# Patient Record
Sex: Female | Born: 1980 | Race: White | Hispanic: No | Marital: Married | Smoking: Former smoker
Health system: Southern US, Community
[De-identification: ages and names within clinical notes are randomized; demographics above are authoritative.]

## PROBLEM LIST (undated history)

## (undated) DIAGNOSIS — T7840XA Allergy, unspecified, initial encounter: Secondary | ICD-10-CM

## (undated) DIAGNOSIS — F419 Anxiety disorder, unspecified: Secondary | ICD-10-CM

## (undated) DIAGNOSIS — E079 Disorder of thyroid, unspecified: Secondary | ICD-10-CM

## (undated) DIAGNOSIS — F32A Depression, unspecified: Secondary | ICD-10-CM

## (undated) DIAGNOSIS — J45909 Unspecified asthma, uncomplicated: Secondary | ICD-10-CM

## (undated) HISTORY — DX: Anxiety disorder, unspecified: F41.9

## (undated) HISTORY — DX: Unspecified asthma, uncomplicated: J45.909

## (undated) HISTORY — PX: TONSILLECTOMY: SUR1361

## (undated) HISTORY — DX: Depression, unspecified: F32.A

## (undated) HISTORY — DX: Allergy, unspecified, initial encounter: T78.40XA

## (undated) HISTORY — PX: ADENOIDECTOMY: SUR15

## (undated) HISTORY — DX: Disorder of thyroid, unspecified: E07.9

---

## 2014-11-26 DIAGNOSIS — N809 Endometriosis, unspecified: Secondary | ICD-10-CM

## 2014-11-26 HISTORY — DX: Endometriosis, unspecified: N80.9

## 2022-01-18 ENCOUNTER — Other Ambulatory Visit: Payer: Self-pay

## 2022-01-18 ENCOUNTER — Ambulatory Visit: Payer: 59 | Admitting: Family

## 2022-01-18 ENCOUNTER — Encounter: Payer: Self-pay | Admitting: Family

## 2022-01-18 VITALS — BP 124/83 | HR 67 | Temp 98.3°F | Ht 67.0 in | Wt 184.0 lb

## 2022-01-18 DIAGNOSIS — G8929 Other chronic pain: Secondary | ICD-10-CM

## 2022-01-18 DIAGNOSIS — E039 Hypothyroidism, unspecified: Secondary | ICD-10-CM | POA: Insufficient documentation

## 2022-01-18 DIAGNOSIS — E042 Nontoxic multinodular goiter: Secondary | ICD-10-CM | POA: Insufficient documentation

## 2022-01-18 DIAGNOSIS — M549 Dorsalgia, unspecified: Secondary | ICD-10-CM | POA: Diagnosis not present

## 2022-01-18 DIAGNOSIS — F411 Generalized anxiety disorder: Secondary | ICD-10-CM | POA: Insufficient documentation

## 2022-01-18 MED ORDER — TIZANIDINE HCL 4 MG PO TABS: 4.0000 mg | ORAL_TABLET | Freq: Four times a day (QID) | ORAL | 0 refills | Status: DC | PRN

## 2022-01-18 NOTE — Assessment & Plan Note (Signed)
Chronic - last scan done early last year, referring to ENDO to follow.

## 2022-01-18 NOTE — Progress Notes (Signed)
New Patient Office Visit  Subjective:  Patient ID: Dana Underwood, female    DOB: Sep 19, 1981  Age: 41 y.o. MRN: [id number]  CC:  Chief Complaint  Patient presents with   Establish Care   Back Pain    Ongoing for 1 year. She has been treated with Prednisone, but still has issues.   Thyroid Problem    hypothyroidism & nodules    HPI Dana Underwood presents for establishing care and to discuss one chronic problem. Pain She reports chronic back pain. There was an injury that may have caused the pain. The pain started about a year ago and is gradually worsening. The pain does not radiate. The pain is described as aching, sharp, and soreness, occurring intermittently. Symptoms are worse in the: evening, nighttime  Aggravating factors: bending backwards, bending forwards, bending sideways, standing, and walking She has tried application of heat, application of ice, and NSAIDs with little relief. 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 Hypothyroidism: Patient presents today for followup of Hypothyroidism. pt also reports hx of thyroid nodules that she monitors annually with Endocrinology. Patient reports positive compliance with daily medication.  Patient denies any of the following symptoms: fatigue, cold intolerance, constipation, weight gain or inability to lose weight, muscle weakness, mental slowing, dry hair and skin. Last TSH and free T4: Lab Results  Component Value Date   FREE T4 1.02 01/18/2022   TSH 1.10 01/18/2022      Past Medical History:  Diagnosis Date   Allergy    Anxiety    Asthma    Depression    Endometriosis 2016   2019   Thyroid disease     History reviewed. No pertinent surgical history.  Family History  Problem Relation Age of Onset   Cancer Mother    Depression Mother    Early death Mother    Anxiety disorder Father    Depression Father    Hypertension Father    Vision loss Father    Cancer  Maternal Grandfather    Heart disease Maternal Grandmother    Hypertension Maternal Grandmother    Cancer Paternal Grandfather    Hypertension Paternal Grandmother    Stroke Paternal Grandmother    ADD / ADHD Brother    Anxiety disorder Brother    Depression Brother     Social History   Socioeconomic History   Marital status: Married    Spouse name: Not on file   Number of children: Not on file   Years of education: Not on file   Highest education level: Not on file  Occupational History   Not on file  Tobacco Use   Smoking status: Former    Types: Cigarettes, E-cigarettes    Quit date: 07/27/2014    Years since quitting: 7.4   Smokeless tobacco: Not on file  Substance and Sexual Activity   Alcohol use: Yes    Comment: Occasionally   Drug use: Not Currently    Types: Marijuana    Comment: Have used in past. Used CBD edibles over 48mos ago   Sexual activity: Yes    Birth control/protection: Other-see comments    Comment: Cycle trackimg  Other Topics Concern   Not on file  Social History Narrative   Not on file   Social Determinants of Health   Financial Resource Strain: Not on file  Food Insecurity: Not on file  Transportation Needs: Not on file  Physical Activity: Not  on file  Stress: Not on file  Social Connections: Not on file  Intimate Partner Violence: Not on file    Objective:   Today's Vitals: BP 124/83    Pulse 67    Temp 98.3 F (36.8 C) (Temporal)    Ht 5\' 7"  (1.702 m)    Wt 184 lb (83.5 kg)    LMP 01/18/2022 (Exact Date)    SpO2 98%    BMI 28.82 kg/m   Physical Exam Vitals and nursing note reviewed.  Constitutional:      Appearance: Normal appearance.  Neck:     Thyroid: No thyroid mass (unable to palpate nodules) or thyromegaly.  Cardiovascular:     Rate and Rhythm: Normal rate and regular rhythm.  Pulmonary:     Effort: Pulmonary effort is normal.     Breath sounds: Normal breath sounds.  Musculoskeletal:        General: Normal range of  motion.     Cervical back: Full passive range of motion without pain.  Skin:    General: Skin is warm and dry.  Neurological:     Mental Status: She is alert.  Psychiatric:        Mood and Affect: Mood normal.        Behavior: Behavior normal.    Assessment & Plan:   Problem List Items Addressed This Visit       Endocrine   Acquired hypothyroidism    Chronic - no blood work in over a year, has enough pills for now. Denies any sx. Checking level today.      Relevant Medications   levothyroxine (SYNTHROID) 88 MCG tablet   Other Relevant Orders   T4, free (Completed)   TSH (Completed)   Ambulatory referral to Endocrinology   Multiple thyroid nodules    Chronic - last scan done early last year, referring to ENDO to follow.      Relevant Medications   levothyroxine (SYNTHROID) 88 MCG tablet   Other Relevant Orders   Ambulatory referral to Endocrinology     Other   Other chronic back pain - Primary   pt reports last year bending over and hearing a pop and having pain ever since. She did not have assessed, pain has been off & on ever since, but worsening lately. 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 She has tried OTC meds with little relief. Starting Tizanidine, advised on use & SE, if too drowsy during day, take qhs to help with sleep issues r/t pain.  Relevant Medications   tiZANidine (ZANAFLEX) 4 MG tablet   Other Relevant Orders   Ambulatory referral to Orthopedic Surgery    Outpatient Encounter Medications as of 01/18/2022  Medication Sig   albuterol (VENTOLIN HFA) 108 (90 Base) MCG/ACT inhaler SMARTSIG:1 Puff(s) Via Inhaler Every 6 Hours PRN   cetirizine (ZYRTEC) 10 MG tablet    L-LYSINE PO Take by mouth.   levothyroxine (SYNTHROID) 88 MCG tablet Take 88 mcg by mouth daily.   LORazepam (ATIVAN) 1 MG tablet Take 0.5-1 mg by mouth daily as needed.   magnesium 30 MG tablet Take 30 mg by mouth 2 (two) times daily.   Multiple Vitamin (MULTIVITAMIN) tablet  Take 1 tablet by mouth daily.   Thiamine HCl (VITAMIN B-1 PO) Take by mouth.   tiZANidine (ZANAFLEX) 4 MG tablet Take 1 tablet (4 mg total) by mouth every 6 (six) hours as needed for muscle spasms.   UNABLE TO FIND Nutrafol   valACYclovir (  VALTREX) 1000 MG tablet Take 2,000 mg by mouth 2 (two) times daily.   No facility-administered encounter medications on file as of 01/18/2022.    Follow-up: Return in about 6 months (around 07/18/2022) for thyroid.   Dulce Sellar, NP

## 2022-01-18 NOTE — Patient Instructions (Signed)
Welcome to [facility]! It was a pleasure meeting you today.  Go to the lab for blood work today. As discussed, I have sent in a new muscle relaxer, Xanaflex, to use as needed for your back pain/muscle spasms.  I have also sent a referral to our Orthopedic office and to Endocrinology regarding your thyroid.  Please schedule a 6 month follow up visit today unless you are established with Endocrinology.    PLEASE NOTE:  If you had any LAB tests please let us know if you have not heard back within a few days. You may see your results on MyChart before we have a chance to review them but we will give you a call once they are reviewed by [place]. If we ordered any REFERRALS today, please let us know if you have not heard from their office within the next week.  Let us know through MyChart if you are needing REFILLS, or have your pharmacy send [place] the request. You can also use MyChart to communicate with me or any office staff.  Please try these tips to maintain a healthy lifestyle:  Eat most of your calories during the day when you are active. Eliminate processed foods including packaged sweets (pies, cakes, cookies), reduce intake of potatoes, white bread, white pasta, and white rice. Look for whole grain options, oat flour or almond flour.  Each meal should contain half fruits/vegetables, one quarter protein, and one quarter carbs (no bigger than a computer mouse).  Cut down on sweet beverages. This includes juice, soda, and sweet tea. Also watch fruit intake, though this is a healthier sweet option, it still contains natural sugar! Limit to 3 servings daily.  Drink at least 1 glass of water with each meal and aim for at least 8 glasses per day  Exercise at least 150 minutes every week.

## 2022-01-18 NOTE — Assessment & Plan Note (Signed)
Chronic - no blood work in over a year, has enough pills for now. Denies any sx. Checking level today.

## 2022-01-18 NOTE — Assessment & Plan Note (Deleted)
Chronic - pt has been many different meds in past and prefers to just have counseling for now, she has an upcoming appointment.

## 2022-01-19 LAB — T4, FREE: Free T4: 1.02 ng/dL (ref 0.60–1.60)

## 2022-01-19 LAB — TSH: TSH: 1.1 u[IU]/mL (ref 0.35–5.50)

## 2022-01-22 DIAGNOSIS — M549 Dorsalgia, unspecified: Secondary | ICD-10-CM | POA: Insufficient documentation

## 2022-01-22 DIAGNOSIS — G8929 Other chronic pain: Secondary | ICD-10-CM | POA: Insufficient documentation

## 2022-01-22 NOTE — Assessment & Plan Note (Signed)
pt reports last year bending over and hearing a pop and having pain ever since. She did not have assessed, pain has been off & on ever since, but worsening lately. 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 She has tried OTC meds with little relief. Starting Tizanidine, advised on use & SE, if too drowsy during day, take qhs to help with sleep issues r/t pain.

## 2022-01-24 ENCOUNTER — Other Ambulatory Visit: Payer: Self-pay

## 2022-01-24 ENCOUNTER — Encounter: Payer: Self-pay | Admitting: Physician Assistant

## 2022-01-24 ENCOUNTER — Ambulatory Visit (INDEPENDENT_AMBULATORY_CARE_PROVIDER_SITE_OTHER): Payer: 59

## 2022-01-24 ENCOUNTER — Ambulatory Visit: Payer: 59 | Admitting: Orthopaedic Surgery

## 2022-01-24 DIAGNOSIS — M545 Low back pain, unspecified: Secondary | ICD-10-CM

## 2022-01-24 DIAGNOSIS — M549 Dorsalgia, unspecified: Secondary | ICD-10-CM

## 2022-01-24 NOTE — Progress Notes (Signed)
? ?Office Visit Note ?  ?Patient: Dana Underwood           ?Date of Birth: January 28, 1981           ?MRN: [id number] ?Visit Date: 01/24/2022 ?             ?Requested by: Dulce Sellar, NP ?[phone] ?[place],  [place] [address] ?PCP: Dulce Sellar, NP ? ? ?Assessment & Plan: ?Visit Diagnoses:  ?1. Low back pain, unspecified back pain laterality, unspecified chronicity, unspecified whether sciatica present   ?2. Mid back pain   ? ? ?Plan: Impression is chronic right-sided thoracic and lumbar pain following a injury which caused a terribly painful and audible pop over a year ago.  At this point, the patient has tried multiple medications, chiropractic care, physical therapy and dry needling all without relief.  I would like to go ahead and order MRIs of the lumbar and thoracic spines to further evaluate for structural abnormalities.  She will follow-up with [place] once these been completed. ? ?Follow-Up Instructions: Return for f/u after MRI.  ? ?Orders:  ?Orders Placed This Encounter  ?Procedures  ? XR Lumbar Spine 2-3 Views  ? XR Thoracic Spine 2 View  ? ?No orders of the defined types were placed in this encounter. ? ? ? ? Procedures: ?No procedures performed ? ? ?Clinical Data: ?No additional findings. ? ? ?Subjective: ?Chief Complaint  ?Patient presents with  ? Lower Back - Pain  ? Middle Back - Pain  ? ? 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  This began back in April 2022 when she picked up for then 23-1/2-year-old son and felt a painful pop to the left middle back.  Since then, she has had constant pain with 4 flareups where her back locks up.  She was initially seen by chiropractor which did improve her symptoms for a short period of time.  She now describes her pain which is associated with tightness.  Pain is worse with any physical activity or standing or sitting on a hard surface.  She does get slight  improvement with thoracic and lumbar flexion.  She has recently taken tizanidine which did help.  She denies any paresthesias to either lower extremity.  She has been to formal physical therapy where she had dry needling which made things worse.  No previous MRI. ? ?Review of Systems as detailed in HPI.  All others reviewed and are negative. ? ? ?Objective: ?Vital Signs: LMP 01/18/2022 (Exact Date)  ? ?Physical Exam well-developed well-nourished female in no acute distress.  Alert and oriented x3. ? ?Ortho Exam spine exam shows moderate right-sided tenderness to the paraspinous musculature around the lower thoracic level.  No spinous tenderness.  No pain with lumbar flexion or extension.  She is neurovascular intact distally. ? ?Specialty Comments:  ?No specialty comments available. ? ?Imaging: ?XR Thoracic Spine 2 View ? ?Result Date: 01/24/2022 ?Mild multilevel spondylosis worst T11-12 ? ?XR Lumbar Spine 2-3 Views ? ?Result Date: 01/24/2022 ?Mild multilevel spondylosis  ? ? ?PMFS History: ?Patient Active Problem List  ? Diagnosis Date Noted  ? Chronic back pain 01/22/2022  ? Acquired hypothyroidism 01/18/2022  ? Multiple thyroid nodules 01/18/2022  ? Generalized anxiety disorder 01/18/2022  ? ?Past Medical History:  ?Diagnosis Date  ? Allergy   ? Anxiety   ? Asthma   ? Depression   ? Endometriosis 2016  ? 2019  ?  Thyroid disease   ?  ?Family History  ?Problem Relation Age of Onset  ? Cancer Mother   ? Depression Mother   ? Early death Mother   ? Anxiety disorder Father   ? Depression Father   ? Hypertension Father   ? Vision loss Father   ? Cancer Maternal Grandfather   ? Heart disease Maternal Grandmother   ? Hypertension Maternal Grandmother   ? Cancer Paternal Grandfather   ? Hypertension Paternal Grandmother   ? Stroke Paternal Grandmother   ? ADD / ADHD Brother   ? Anxiety disorder Brother   ? Depression Brother   ?  ?History reviewed. No pertinent surgical history. ?Social History  ? ?Occupational History  ? Not  on file  ?Tobacco Use  ? Smoking status: Former  ?  Types: Cigarettes, E-cigarettes  ?  Quit date: 07/27/2014  ?  Years since quitting: 7.5  ? Smokeless tobacco: Not on file  ?Substance and Sexual Activity  ? Alcohol use: Yes  ?  Comment: Occasionally  ? Drug use: Not Currently  ?  Types: Marijuana  ?  Comment: Have used in past. Used CBD edibles over 70mos ago  ? Sexual activity: Yes  ?  Birth control/protection: Other-see comments  ?  Comment: Cycle trackimg  ? ? ? ? ? ? ?

## 2022-01-29 ENCOUNTER — Other Ambulatory Visit: Payer: Self-pay | Admitting: Family

## 2022-01-29 DIAGNOSIS — M549 Dorsalgia, unspecified: Secondary | ICD-10-CM

## 2022-01-29 DIAGNOSIS — G8929 Other chronic pain: Secondary | ICD-10-CM

## 2022-02-11 ENCOUNTER — Other Ambulatory Visit: Payer: Self-pay

## 2022-02-11 ENCOUNTER — Ambulatory Visit
Admission: RE | Admit: 2022-02-11 | Discharge: 2022-02-11 | Disposition: A | Discharge status: Home / Self Care | Payer: 59 | Source: Ambulatory Visit | Attending: Physician Assistant | Admitting: Physician Assistant

## 2022-02-11 DIAGNOSIS — M549 Dorsalgia, unspecified: Secondary | ICD-10-CM

## 2022-02-11 DIAGNOSIS — M545 Low back pain, unspecified: Secondary | ICD-10-CM

## 2022-02-11 IMAGING — MR MR THORACIC SPINE W/O CM
4 of 5 series · 21 of 48 positions shown · non-contrast
Comparison: Thoracic and lumbar spine radiographs [DATE].

CLINICAL DATA: Mid back pain radiating to right shoulder with
stiffness and tightness for 1 year, and notes could possibly be
related to a lifting injury.

EXAM:
MRI THORACIC AND LUMBAR SPINE WITHOUT CONTRAST
TECHNIQUE: Multiplanar and multiecho pulse sequences of the thoracic and lumbar
spine were obtained without intravenous contrast.

[Series 4: STIR · sagittal · 3.0mm · 1.37mm/px · 3 of 19 slices shown]
[im 1/19]
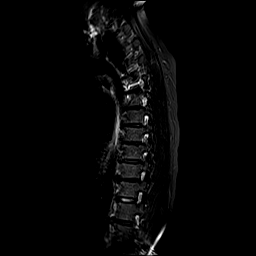
[im 10/19]
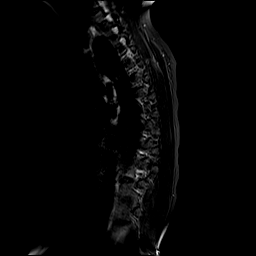
[im 19/19]
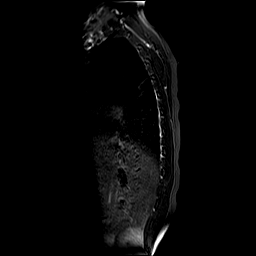

[Series 5: T2 post-contrast · sagittal · 3.0mm · 0.68mm/px · 5 of 19 slices shown]
[im 1/19]
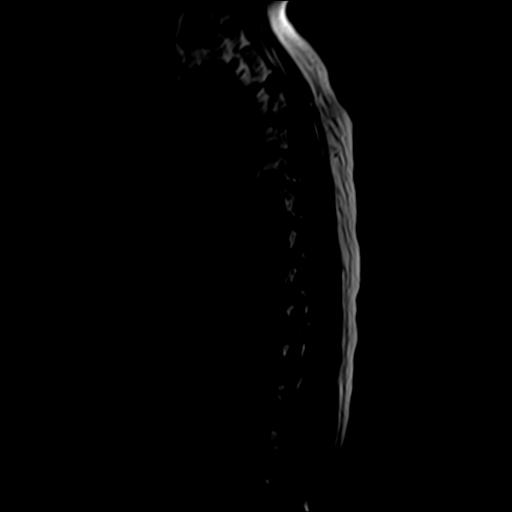
[im 5/19]
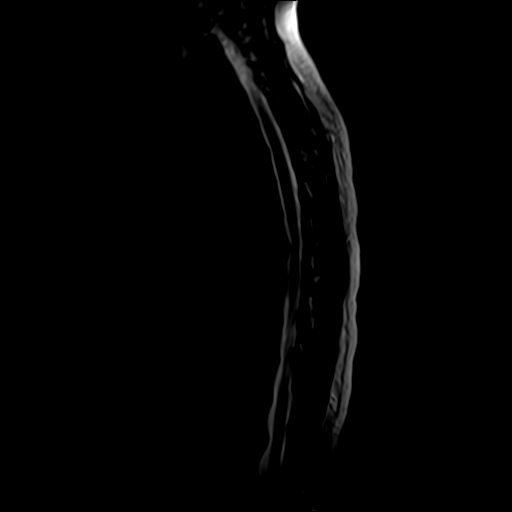
[im 10/19]
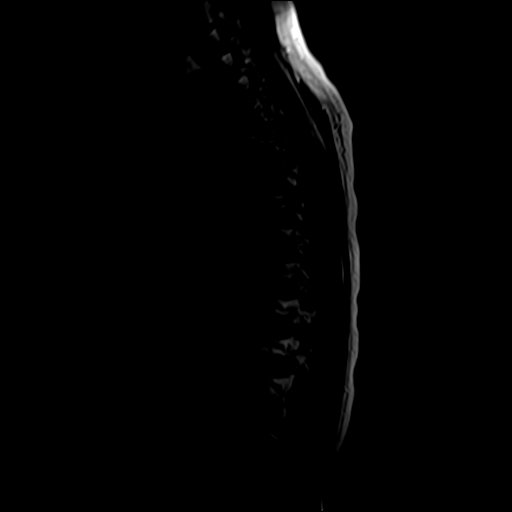
[im 14/19]
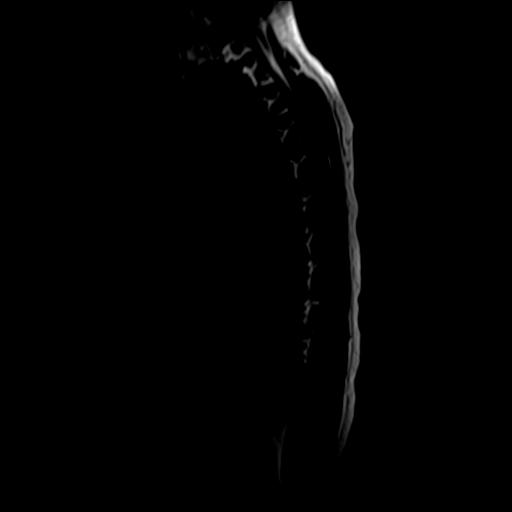
[im 19/19]
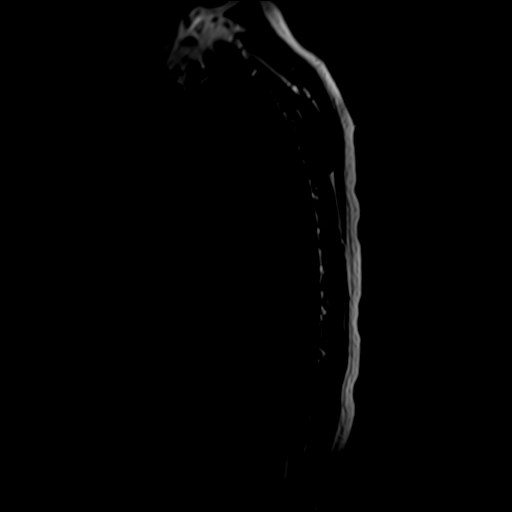

[Series 6: T1 · sagittal · 3.0mm · 0.68mm/px · 3 of 19 slices shown]
[im 4/19]
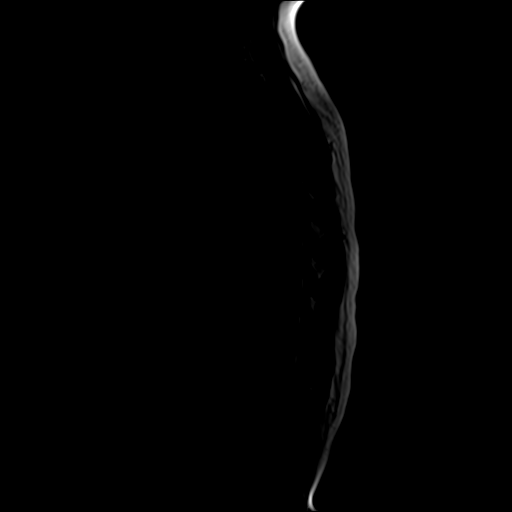
[im 11/19]
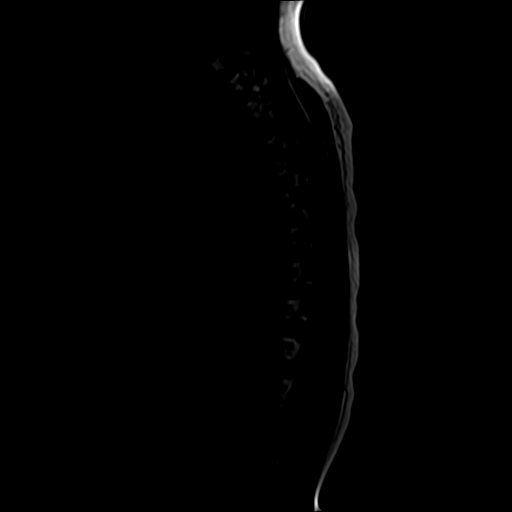
[im 19/19]
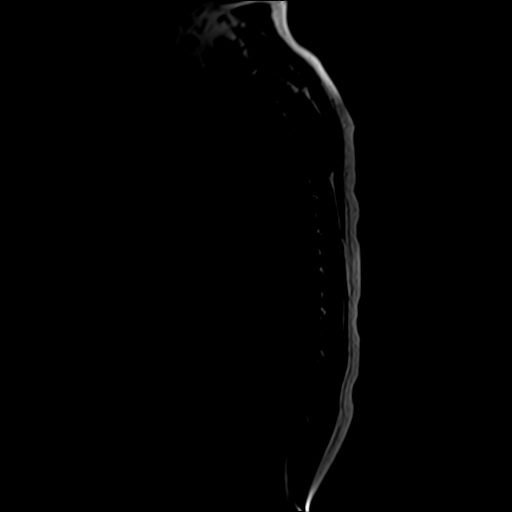

[Series 7: T2 · axial · 4.0mm · 0.41mm/px · z∈[-283,-5]mm · 10 of 54 slices shown]
[im 4/54]
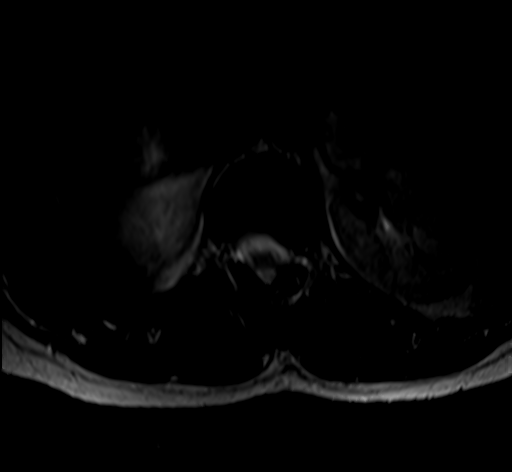
[im 8/54]
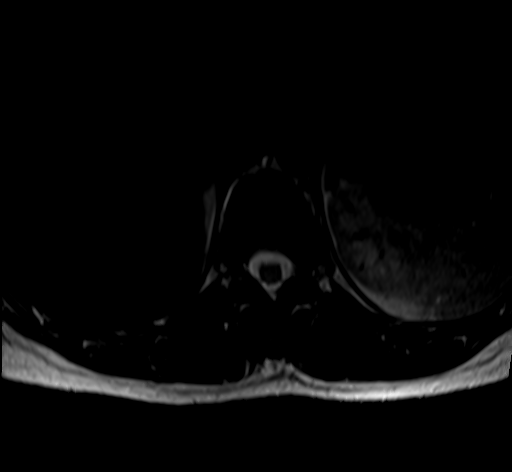
[im 11/54]
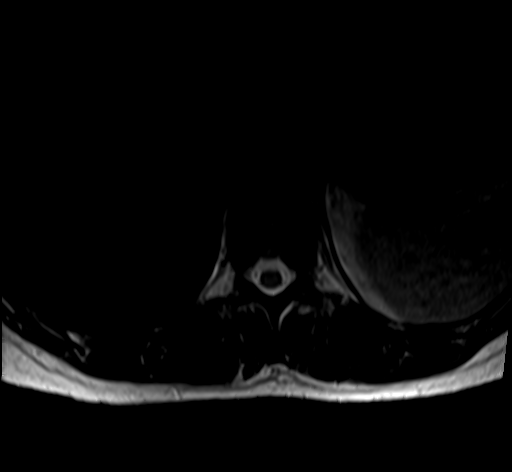
[im 18/54]
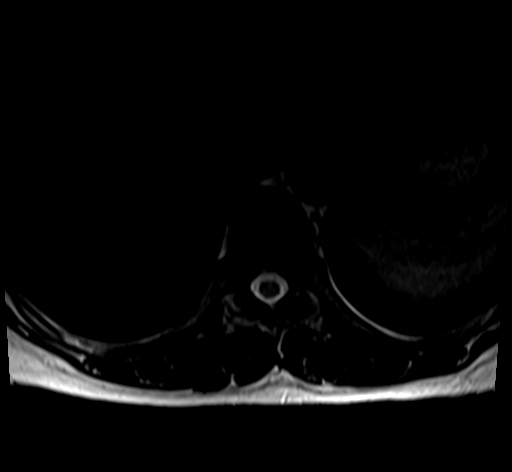
[im 25/54]
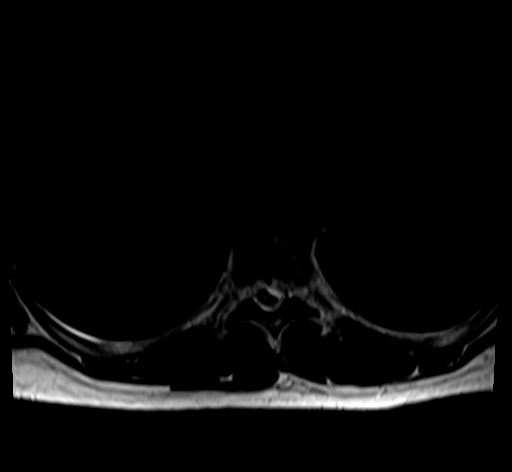
[im 29/54]
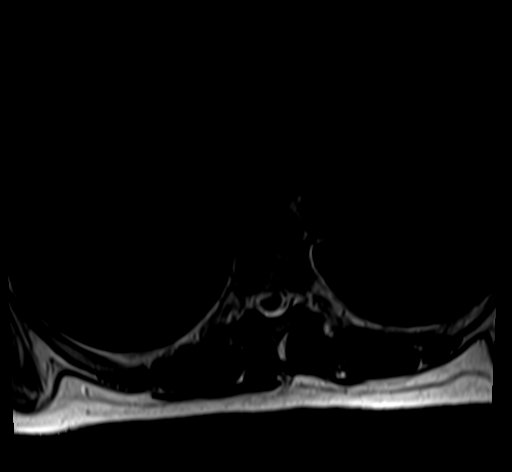
[im 32/54]
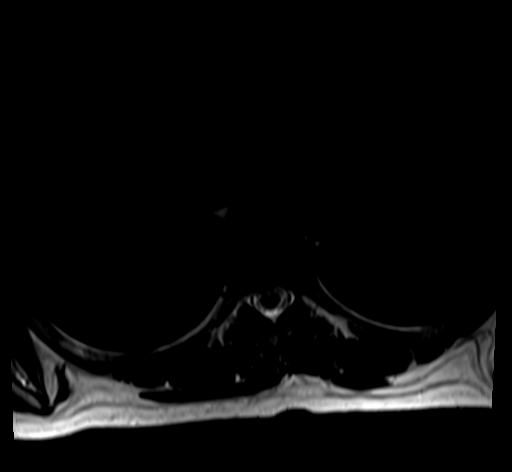
[im 39/54]
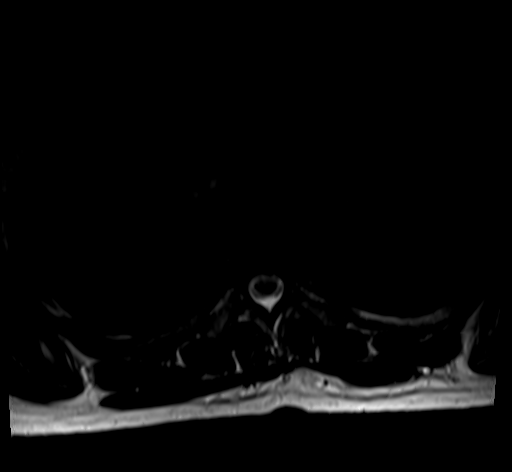
[im 46/54]
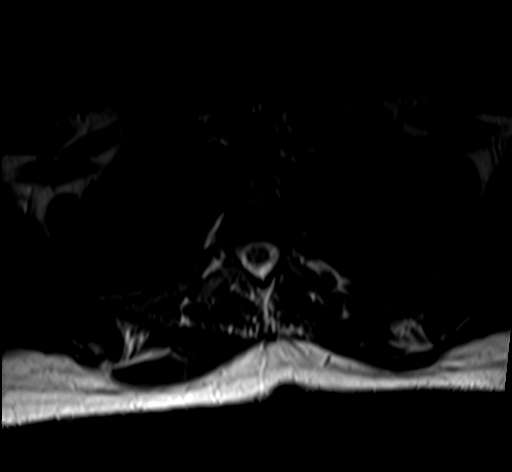
[im 54/54]
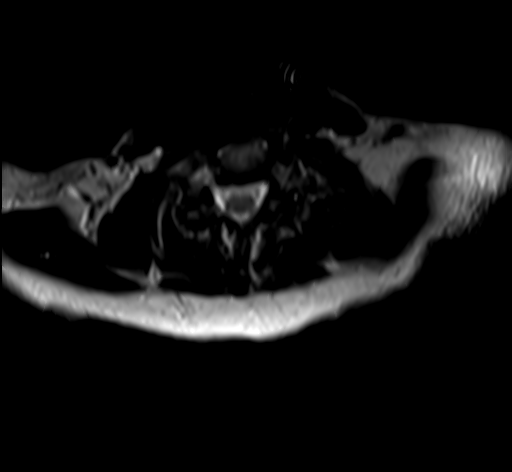

[21 of 48 positions shown; findings below may reference images not displayed]

FINDINGS: MRI THORACIC SPINE FINDINGS

Alignment:  Normal.

Vertebrae: Vertebral body heights are preserved. Marrow signal is
normal. There is no suspicious marrow signal abnormality. There is
no marrow edema.

Cord: There is mild cord compression at T8-T9 with no cord signal
abnormality. The cord is otherwise normal in signal and morphology
throughout.

Paraspinal and other soft tissues: Unremarkable.

Disc levels:

There is mild disc desiccation with associated degenerative endplate
change throughout the thoracic spine. There is mild multilevel facet
arthropathy.

There is a prominent right paracentral disc protrusion at T7-T8
resulting in mild spinal canal stenosis with slight indentation of
the ventral cord without significant neural foraminal stenosis

There is large left paracentral disc protrusion at T8-T9 resulting
in moderate spinal canal stenosis with indentation of the cord
without significant neural foraminal stenosis.

There is no other significant spinal canal or neural foraminal
stenosis.

MRI LUMBAR SPINE FINDINGS

Segmentation: Standard; the lowest formed disc space is designated
L5-S1.

Alignment:  Normal.

Vertebrae: Vertebral body heights are preserved. Marrow signal is
normal. There is no suspicious marrow signal abnormality. There is
no marrow edema.

Conus medullaris and cauda equina: Conus extends to the L1 level.
Conus and cauda equina appear normal.

Paraspinal and other soft tissues: Unremarkable.

Disc levels:

There is disc desiccation and narrowing at T12-L1, L4-L5, and L5-S1,
more advanced at T12-L1. The other disc heights are overall
preserved.

T12-L1: There is a small right paracentral disc protrusion without
significant spinal canal or neural foraminal stenosis

L1-L2: No significant spinal canal or neural foraminal stenosis

L2-L3: No significant spinal canal or neural foraminal stenosis

L3-L4: No significant spinal canal or neural foraminal stenosis

L4-L5: There is a mild disc bulge and mild bilateral facet
arthropathy resulting in mild left and no significant right neural
foraminal stenosis and no significant spinal canal stenosis

L5-S1: There is a small central disc protrusion without significant
spinal canal or neural foraminal stenosis.
IMPRESSION: MR THORACIC SPINE IMPRESSION

1. Large left paracentral protrusion at T8-T9 resulting in moderate
spinal canal stenosis with mild cord compression but no cord signal
abnormality.
2. Additional prominent protrusion at T7-T8 resulting in mild spinal
canal stenosis with slight indentation of the ventral cord.

MR LUMBAR SPINE IMPRESSION

1. Mild multilevel degenerative changes without significant spinal
canal or neural foraminal stenosis.

## 2022-02-11 IMAGING — MR MR LUMBAR SPINE W/O CM
4 of 5 series · 25 of 48 positions shown · non-contrast
Comparison: Thoracic and lumbar spine radiographs [DATE].

CLINICAL DATA: Mid back pain radiating to right shoulder with
stiffness and tightness for 1 year, and notes could possibly be
related to a lifting injury.

EXAM:
MRI THORACIC AND LUMBAR SPINE WITHOUT CONTRAST
TECHNIQUE: Multiplanar and multiecho pulse sequences of the thoracic and lumbar
spine were obtained without intravenous contrast.

[Series 3: T2 · sagittal · 4.0mm · 0.59mm/px · 6 of 17 slices shown (1 of 2)]
[im 1/17]
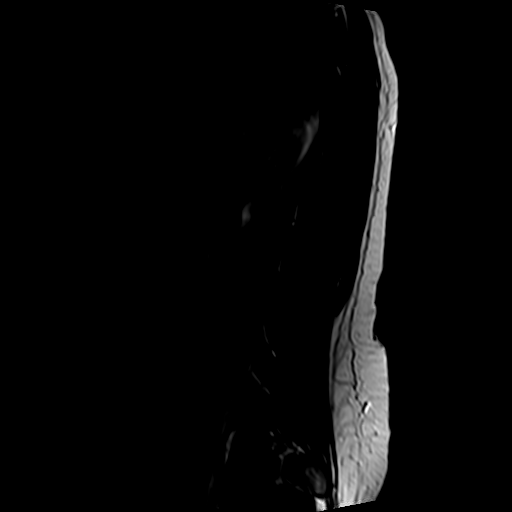
[im 4/17]
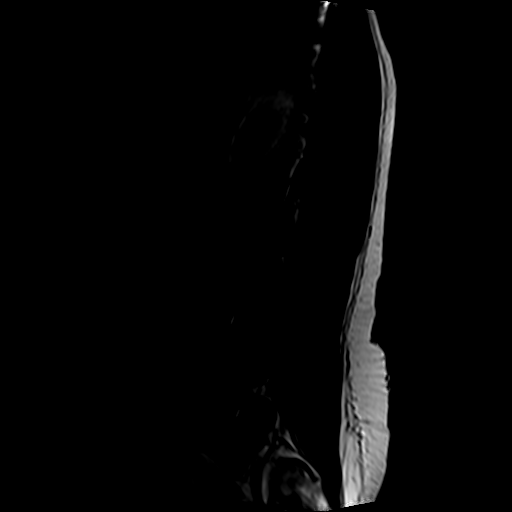
[im 7/17]
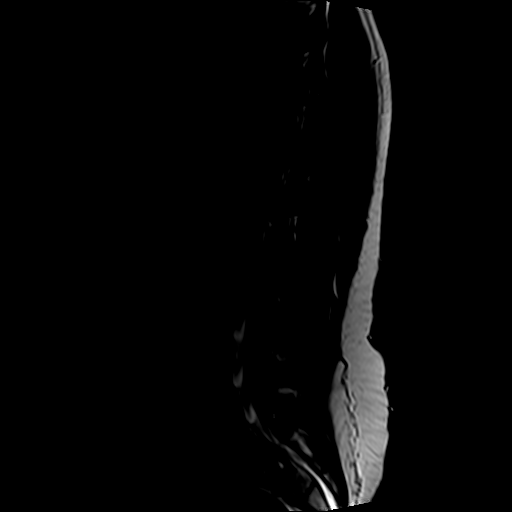
[im 10/17]
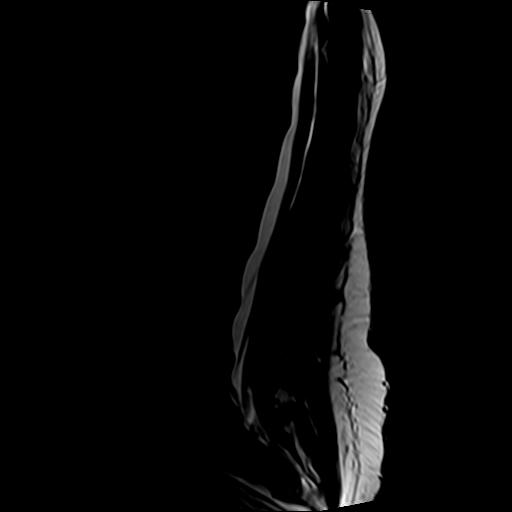
[im 13/17]
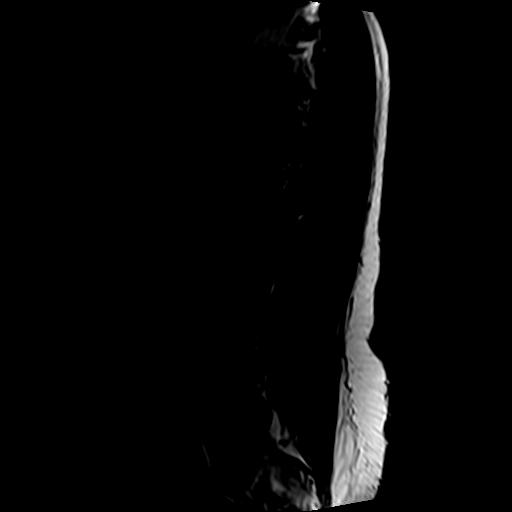
[im 17/17]
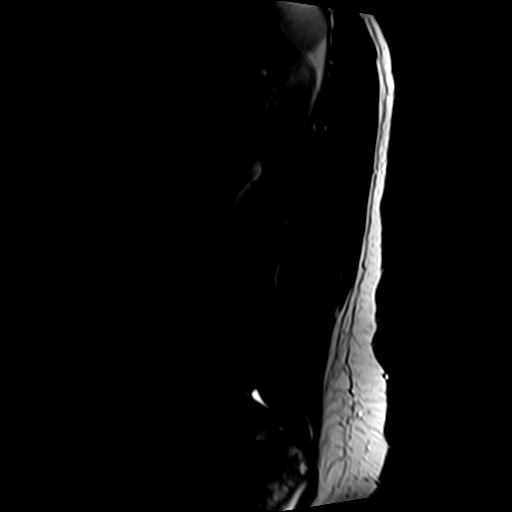

[Series 5: T1 · sagittal · 4.0mm · 0.59mm/px · 5 of 17 slices shown (1 of 2)]
[im 1/17]
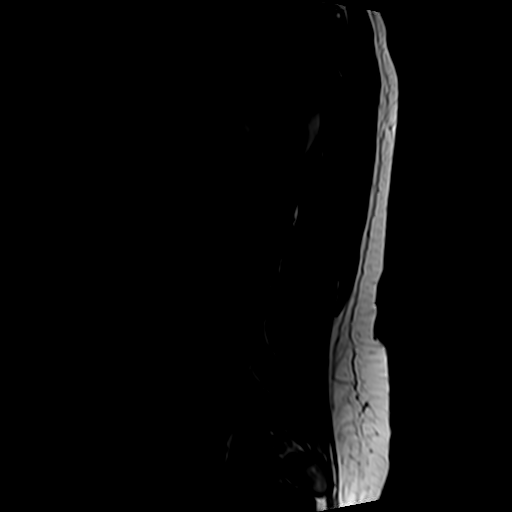
[im 5/17]
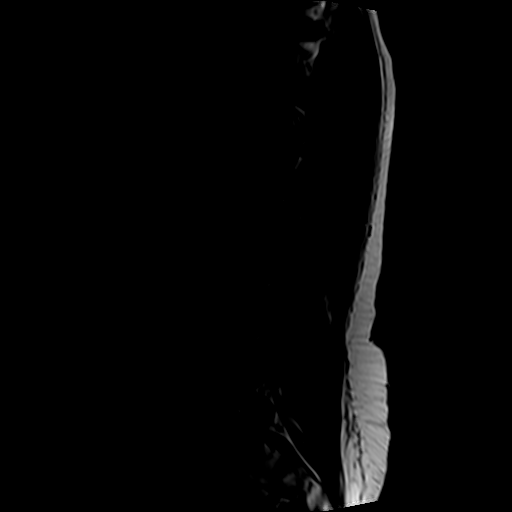
[im 9/17]
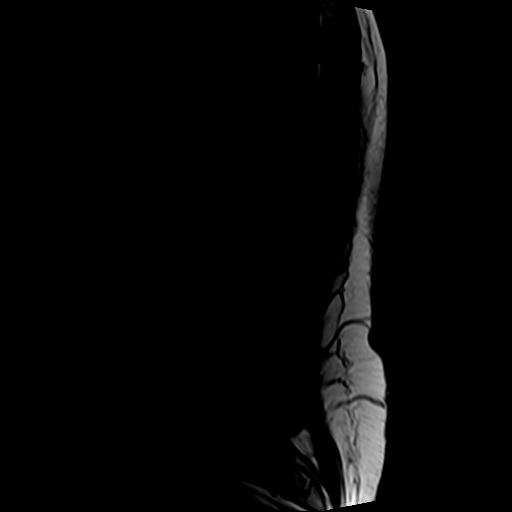
[im 13/17]
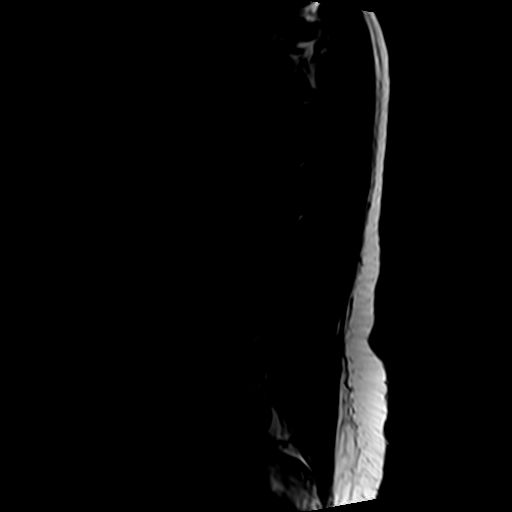
[im 17/17]
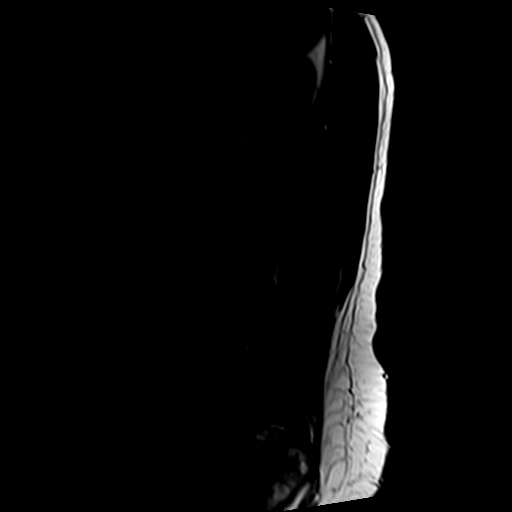

[Series 6: T2 · axial · 4.0mm · 0.78mm/px · z∈[-96,+160]mm · 10 of 51 slices shown (2 of 2)]
[im 4/51]
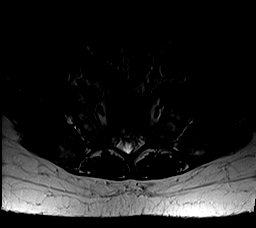
[im 7/51]
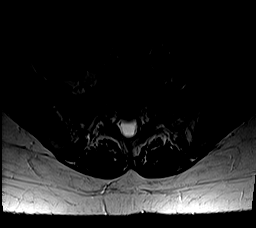
[im 11/51]
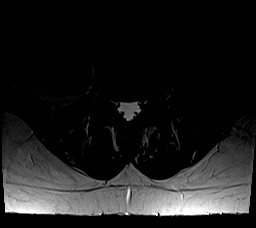
[im 17/51]
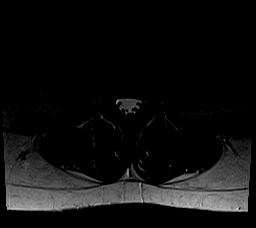
[im 24/51]
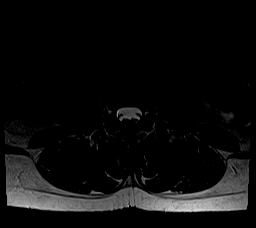
[im 27/51]
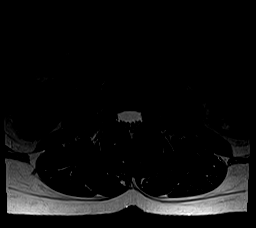
[im 31/51]
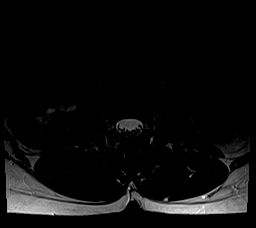
[im 37/51]
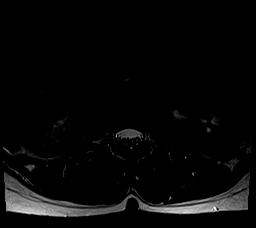
[im 44/51]
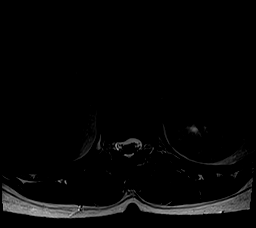
[im 51/51]
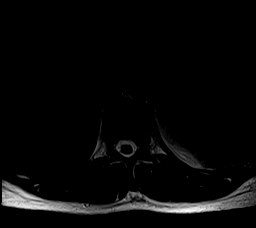

[Series 7: T1 · axial · 4.0mm · 0.39mm/px · z∈[-96,+123]mm · 4 of 51 slices shown (2 of 2)]
[im 4/51]
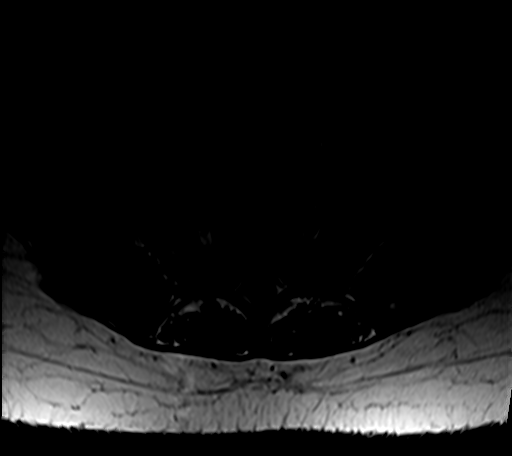
[im 7/51]
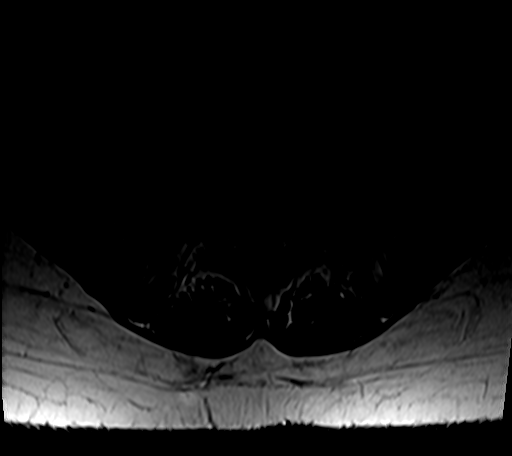
[im 27/51]
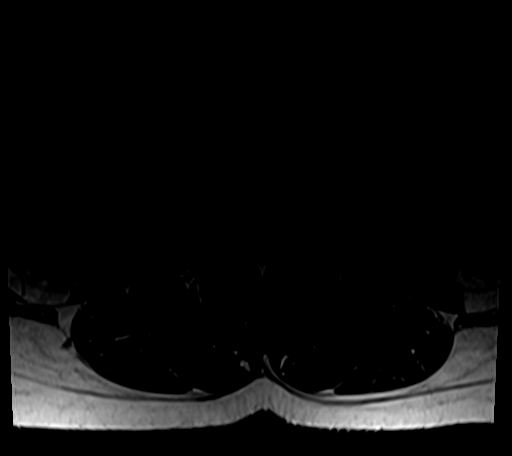
[im 44/51]
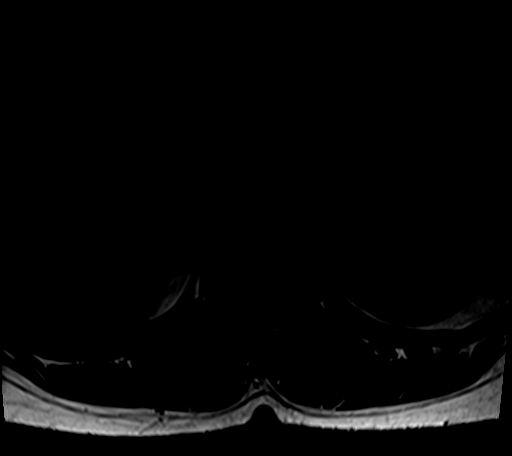

[25 of 48 positions shown; findings below may reference images not displayed]

FINDINGS: MRI THORACIC SPINE FINDINGS

Alignment:  Normal.

Vertebrae: Vertebral body heights are preserved. Marrow signal is
normal. There is no suspicious marrow signal abnormality. There is
no marrow edema.

Cord: There is mild cord compression at T8-T9 with no cord signal
abnormality. The cord is otherwise normal in signal and morphology
throughout.

Paraspinal and other soft tissues: Unremarkable.

Disc levels:

There is mild disc desiccation with associated degenerative endplate
change throughout the thoracic spine. There is mild multilevel facet
arthropathy.

There is a prominent right paracentral disc protrusion at T7-T8
resulting in mild spinal canal stenosis with slight indentation of
the ventral cord without significant neural foraminal stenosis

There is large left paracentral disc protrusion at T8-T9 resulting
in moderate spinal canal stenosis with indentation of the cord
without significant neural foraminal stenosis.

There is no other significant spinal canal or neural foraminal
stenosis.

MRI LUMBAR SPINE FINDINGS

Segmentation: Standard; the lowest formed disc space is designated
L5-S1.

Alignment:  Normal.

Vertebrae: Vertebral body heights are preserved. Marrow signal is
normal. There is no suspicious marrow signal abnormality. There is
no marrow edema.

Conus medullaris and cauda equina: Conus extends to the L1 level.
Conus and cauda equina appear normal.

Paraspinal and other soft tissues: Unremarkable.

Disc levels:

There is disc desiccation and narrowing at T12-L1, L4-L5, and L5-S1,
more advanced at T12-L1. The other disc heights are overall
preserved.

T12-L1: There is a small right paracentral disc protrusion without
significant spinal canal or neural foraminal stenosis

L1-L2: No significant spinal canal or neural foraminal stenosis

L2-L3: No significant spinal canal or neural foraminal stenosis

L3-L4: No significant spinal canal or neural foraminal stenosis

L4-L5: There is a mild disc bulge and mild bilateral facet
arthropathy resulting in mild left and no significant right neural
foraminal stenosis and no significant spinal canal stenosis

L5-S1: There is a small central disc protrusion without significant
spinal canal or neural foraminal stenosis.
IMPRESSION: MR THORACIC SPINE IMPRESSION

1. Large left paracentral protrusion at T8-T9 resulting in moderate
spinal canal stenosis with mild cord compression but no cord signal
abnormality.
2. Additional prominent protrusion at T7-T8 resulting in mild spinal
canal stenosis with slight indentation of the ventral cord.

MR LUMBAR SPINE IMPRESSION

1. Mild multilevel degenerative changes without significant spinal
canal or neural foraminal stenosis.

## 2022-02-13 NOTE — Progress Notes (Signed)
F/u to discuss

## 2022-02-15 ENCOUNTER — Encounter: Payer: 59 | Admitting: Family

## 2022-02-20 ENCOUNTER — Other Ambulatory Visit: Payer: Self-pay

## 2022-02-20 ENCOUNTER — Encounter: Payer: Self-pay | Admitting: Orthopaedic Surgery

## 2022-02-20 ENCOUNTER — Ambulatory Visit (INDEPENDENT_AMBULATORY_CARE_PROVIDER_SITE_OTHER): Payer: 59 | Admitting: Orthopaedic Surgery

## 2022-02-20 DIAGNOSIS — M546 Pain in thoracic spine: Secondary | ICD-10-CM | POA: Diagnosis not present

## 2022-02-20 NOTE — Progress Notes (Signed)
? ?Office Visit Note ?  ?Patient: Dana Underwood           ?Date of Birth: 08/03/1981           ?MRN: [id number] ?Visit Date: 02/20/2022 ?             ?Requested by: Jeanie Sewer, NP ?[place],  [place] [address] ?PCP: Jeanie Sewer, NP ? ? ?Assessment & Plan: ?Visit Diagnoses:  ?1. Pain in thoracic spine   ? ? ?Plan: Impression is chronic back pain with prominent protrusion T7-T8 resulting in mild spinal canal stenosis in addition to large left paracentral protrusion T8-9 resulting in moderate spinal canal stenosis with mild cord compression.  At this point, would like to refer the patient to Dr. Ernestina Patches for epidural steroid injection.  Follow-up with [place] as needed. ? ?Follow-Up Instructions: Return if symptoms worsen or fail to improve.  ? ?Orders:  ?No orders of the defined types were placed in this encounter. ? ?No orders of the defined types were placed in this encounter. ? ? ? ? Procedures: ?No procedures performed ? ? ?Clinical Data: ?No additional findings. ? ? ?Subjective: ?Chief Complaint  ?Patient presents with  ? Lower Back - Pain  ? Middle Back - Pain  ? ? ?HPI patient is a pleasant 41 year old female who comes in today to discuss MRI results of the lumbar and thoracic spine.  Lumbar spine is unremarkable.  Thoracic spine is showing prominent protrusion T7-T8 resulting in mild spinal canal stenosis in addition to large left paracentral protrusion T8-9 resulting in moderate spinal canal stenosis with mild cord compression.  Patient continues to be intermittently symptomatic ? ? ? ? ?Objective: ?Vital Signs: There were no vitals taken for this visit. ? ? ? ?Ortho Exam unchanged lumbar and thoracic spine exams ? ?Specialty Comments:  ?No specialty comments available. ? ?Imaging: ?No new imaging ? ? ?PMFS History: ?Patient Active Problem List  ? Diagnosis Date Noted  ? Chronic back pain 01/22/2022  ? Acquired hypothyroidism 01/18/2022  ? Multiple thyroid nodules 01/18/2022  ?  Generalized anxiety disorder 01/18/2022  ? ?Past Medical History:  ?Diagnosis Date  ? Allergy   ? Anxiety   ? Asthma   ? Depression   ? Endometriosis 2016  ? 2019  ? Thyroid disease   ?  ?Family History  ?Problem Relation Age of Onset  ? Cancer Mother   ? Depression Mother   ? Early death Mother   ? Anxiety disorder Father   ? Depression Father   ? Hypertension Father   ? Vision loss Father   ? Cancer Maternal Grandfather   ? Heart disease Maternal Grandmother   ? Hypertension Maternal Grandmother   ? Cancer Paternal Grandfather   ? Hypertension Paternal Grandmother   ? Stroke Paternal Grandmother   ? ADD / ADHD Brother   ? Anxiety disorder Brother   ? Depression Brother   ?  ?History reviewed. No pertinent surgical history. ?Social History  ? ?Occupational History  ? Not on file  ?Tobacco Use  ? Smoking status: Former  ?  Types: Cigarettes, E-cigarettes  ?  Quit date: 07/27/2014  ?  Years since quitting: 7.5  ? Smokeless tobacco: Not on file  ?Substance and Sexual Activity  ? Alcohol use: Yes  ?  Comment: Occasionally  ? Drug use: Not Currently  ?  Types: Marijuana  ?  Comment: Have used in past. Used CBD edibles over 19mos ago  ? Sexual activity: Yes  ?  Birth control/protection: Other-see comments  ?  Comment: Cycle trackimg  ? ? ? ? ? ? ?

## 2022-03-13 ENCOUNTER — Ambulatory Visit (INDEPENDENT_AMBULATORY_CARE_PROVIDER_SITE_OTHER): Payer: Commercial Managed Care - PPO | Admitting: Physical Medicine and Rehabilitation

## 2022-03-13 ENCOUNTER — Encounter: Payer: Self-pay | Admitting: Physical Medicine and Rehabilitation

## 2022-03-13 DIAGNOSIS — M546 Pain in thoracic spine: Secondary | ICD-10-CM

## 2022-03-13 DIAGNOSIS — M519 Unspecified thoracic, thoracolumbar and lumbosacral intervertebral disc disorder: Secondary | ICD-10-CM | POA: Diagnosis not present

## 2022-03-13 DIAGNOSIS — M4804 Spinal stenosis, thoracic region: Secondary | ICD-10-CM

## 2022-03-13 NOTE — Progress Notes (Signed)
Pt state middle back pain. Pt state walking, standing and sitting makes the pain worse. Pt state bending and clean house makes the pain worse. Pt state she uses heat and pain meds to help ease her pain. ? ?Numeric Pain Rating Scale and Functional Assessment ?Average Pain 9 ?Pain Right Now 4 ?My pain is intermittent, dull, stabbing, tingling, and aching ?Pain is worse with: bending, sitting, standing, and some activites ?Pain improves with: heat/ice, therapy/exercise, and medication ? ? ?In the last MONTH (on 0-10 scale) has pain interfered with the following? ? ?1. General activity like being  able to carry out your everyday physical activities such as walking, climbing stairs, carrying groceries, or moving a chair?  ?Rating(5) ? ?2. Relation with others like being able to carry out your usual social activities and roles such as  activities at home, at work and in your community. ?Rating(6) ? ?3. Enjoyment of life such that you have  been bothered by emotional problems such as feeling anxious, depressed or irritable?  ?Rating(7) ? ?

## 2022-03-13 NOTE — Progress Notes (Signed)
? ?Dana Underwood - 41 y.o. female MRN [id number]  Date of birth: 09/05/1981 ? ?Office Visit Note: ?Visit Date: 03/13/2022 ?PCP: Dulce Sellar, NP ?Referred by: Dulce Sellar, NP ? ?Subjective: ?Chief Complaint  ?Patient presents with  ? Middle Back - Pain  ? ?HPI: Dana Underwood is a 41 y.o. female who comes in today At the request of Dr. Glee Arvin for evaluation of bilateral mid thoracic back pain, right greater than left. Patient reports pain has been ongoing for several months.  Patient attributes her pain to an incident several months ago when she picked her child up and immediately felt pain and heard a popping sensation to thoracic back. Patient states her pain is exacerbated by movement and activity, describes as sore and tight sensation, currently rates as 4 out of 10. Patient reports some relief of pain with home exercise regimen, rest and use of medications. Patient currently taking Tizanidine as needed with minimal relief of pain. Patient has completed chiropractic treatments[place] She has also recently completed formal physical therapy/dry needling treatments at [facility]. Patient states short term relief of pain with chiropractor treatments/PT and worsening pain with dry needling. Patients recent thoracic MRI exhibits large left paracentral protrusion at T8-T9 resulting in moderate spinal canal stenosis and prominent right paracentral disc protrusion at T7-T8 causing mild spinal canal stenosis. Recent lumbar  Patient states her pain is starting to impact daily activities and is becoming more aggravating. 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 Patient denies focal weakness, numbness and tingling. Patient denies recent trauma or falls.  ? ? ?Review of Systems  ?Musculoskeletal:  Positive for back pain.  ?Neurological:  Negative for tingling, sensory change, focal weakness and weakness.  ?All other systems reviewed and are negative.  Otherwise per HPI. ? ?Assessment & Plan: ?Visit Diagnoses:  ?  ICD-10-CM   ?1. Pain in thoracic spine  M54.6   ?  ?2. 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  ?3. Spinal stenosis of thoracic region  M48.04   ?  ?   ?Plan: Findings:  ?Chronic, worsening and severe bilateral mid thoracic back pain, right greater than left. Patient continues to have severe pain despite good conservative therapies such as home exercise regimen, chiropractic treatments, physical therapy, and use of medications. Patients clinical presentation and exam are consistent with thoracic disc disorder, no palpable trigger points noted to thoracic back upon exam. I did discuss patients recent thoracic MRI with her today. We believe the next step is to perform a diagnostic and hopefully therapeutic right T8-T9 interlaminar epidural steroid injection under fluoroscopic guidance. Patient is not currently undergoing long term anticoagulant therapy. I did explain thoracic epidural steroid injection procedure with patient today and she has no questions at this time. Patient states she would like to take some time to think about treatment plan and discuss options with her husband. Patient states she will let us know if she decides to proceed with epidural steroid injection. Patient encouraged to remain active and to continue home exercise regimen as tolerated. No red flag symptoms noted upon exam today.   ? ?Meds & Orders: No orders of the defined types were placed in this encounter. ? No orders of the defined types were placed in this encounter. ?  ?Follow-up: No follow-ups on file.  ? ?Procedures: ?No procedures performed  ?   ? ?Clinical History: ?EXAM: ?MRI THORACIC AND LUMBAR SPINE WITHOUT CONTRAST ?  ?TECHNIQUE: ?Multiplanar  and multiecho pulse sequences of the thoracic and lumbar ?spine were obtained without intravenous contrast. ?  ?COMPARISON:  Thoracic and lumbar spine radiographs 01/24/2022. ?  ?FINDINGS: ?MRI THORACIC SPINE  FINDINGS ?  ?Alignment:  Normal. ?  ?Vertebrae: Vertebral body heights are preserved. Marrow signal is ?normal. There is no suspicious marrow signal abnormality. There is ?no marrow edema. ?  ?Cord: There is mild cord compression at T8-T9 with no cord signal ?abnormality. The cord is otherwise normal in signal and morphology ?throughout. ?  ?Paraspinal and other soft tissues: Unremarkable. ?  ?Disc levels: ?  ?There is mild disc desiccation with associated degenerative endplate ?change throughout the thoracic spine. There is mild multilevel facet ?arthropathy. ?  ?There is a prominent right paracentral disc protrusion at T7-T8 ?resulting in mild spinal canal stenosis with slight indentation of ?the ventral cord without significant neural foraminal stenosis ?  ?There is large left paracentral disc protrusion at T8-T9 resulting ?in moderate spinal canal stenosis with indentation of the cord ?without significant neural foraminal stenosis. ?  ?There is no other significant spinal canal or neural foraminal ?stenosis. ?  ?MRI LUMBAR SPINE FINDINGS ?  ?Segmentation: Standard; the lowest formed disc space is designated ?L5-S1. ?  ?Alignment:  Normal. ?  ?Vertebrae: Vertebral body heights are preserved. Marrow signal is ?normal. There is no suspicious marrow signal abnormality. There is ?no marrow edema. ?  ?Conus medullaris and cauda equina: Conus extends to the L1 level. ?Conus and cauda equina appear normal. ?  ?Paraspinal and other soft tissues: Unremarkable. ?  ?Disc levels: ?  ?There is disc desiccation and narrowing at T12-L1, L4-L5, and L5-S1, ?more advanced at T12-L1. The other disc heights are overall ?preserved. ?  ?T12-L1: There is a small right paracentral disc protrusion without ?significant spinal canal or neural foraminal stenosis ?  ?L1-L2: No significant spinal canal or neural foraminal stenosis ?  ?L2-L3: No significant spinal canal or neural foraminal stenosis ?  ?L3-L4: No significant spinal canal or  neural foraminal stenosis ?  ?L4-L5: There is a mild disc bulge and mild bilateral facet ?arthropathy resulting in mild left and no significant right neural ?foraminal stenosis and no significant spinal canal stenosis ?  ?L5-S1: There is a small central disc protrusion without significant ?spinal canal or neural foraminal stenosis. ?  ?IMPRESSION: ?MR THORACIC SPINE IMPRESSION ?  ?1. Large left paracentral protrusion at T8-T9 resulting in moderate ?spinal canal stenosis with mild cord compression but no cord signal ?abnormality. ?2. Additional prominent protrusion at T7-T8 resulting in mild spinal ?canal stenosis with slight indentation of the ventral cord. ?  ?MR LUMBAR SPINE IMPRESSION ?  ?1. Mild multilevel degenerative changes without significant spinal ?canal or neural foraminal stenosis. ?  ?  ?Electronically Signed ?  By: Lesia HausenPeter  Noone M.D. ?  On: 02/13/2022 10:23  ? ?She reports that she quit smoking about 7 years ago. Her smoking use included cigarettes and e-cigarettes. She does not have any smokeless tobacco history on file. No results for input(s): HGBA1C, LABURIC in the last 8760 hours. ? ?Objective:  VS:  HT:    WT:   BMI:     BP:   HR: bpm  TEMP: ( )  RESP:  ?Physical Exam ?Vitals and nursing note reviewed.  ?HENT:  ?   Head: Normocephalic and atraumatic.  ?   Right Ear: External ear normal.  ?   Left Ear: External ear normal.  ?   Nose: Nose normal.  ?   Mouth/Throat:  ?  Mouth: Mucous membranes are moist.  ?Eyes:  ?   Extraocular Movements: Extraocular movements intact.  ?Cardiovascular:  ?   Rate and Rhythm: Normal rate.  ?   Pulses: Normal pulses.  ?Pulmonary:  ?   Effort: Pulmonary effort is normal.  ?Abdominal:  ?   General: Abdomen is flat. There is no distension.  ?Musculoskeletal:     ?   General: Tenderness present.  ?   Cervical back: Normal range of motion.  ?   Comments: Pt rises from seated position to standing without difficulty. Good lumbar range of motion. Strong distal strength  without clonus, no pain upon palpation of greater trochanters. No tenderness noted upon palpation of bilateral thoracic paraspinal regions. Sensation intact bilaterally. Walks independently, gait steady.   ?Skin: ?   General

## 2022-03-20 ENCOUNTER — Encounter: Payer: 59 | Admitting: Family

## 2022-04-27 ENCOUNTER — Encounter: Payer: Self-pay | Admitting: Family

## 2022-04-27 ENCOUNTER — Ambulatory Visit: Payer: Commercial Managed Care - PPO | Admitting: Family

## 2022-04-27 VITALS — BP 98/62 | HR 60 | Temp 98.4°F | Ht 67.0 in | Wt 178.1 lb

## 2022-04-27 DIAGNOSIS — J309 Allergic rhinitis, unspecified: Secondary | ICD-10-CM | POA: Diagnosis not present

## 2022-04-27 DIAGNOSIS — M79674 Pain in right toe(s): Secondary | ICD-10-CM | POA: Diagnosis not present

## 2022-04-27 DIAGNOSIS — M549 Dorsalgia, unspecified: Secondary | ICD-10-CM

## 2022-04-27 DIAGNOSIS — E039 Hypothyroidism, unspecified: Secondary | ICD-10-CM | POA: Diagnosis not present

## 2022-04-27 DIAGNOSIS — G8929 Other chronic pain: Secondary | ICD-10-CM

## 2022-04-27 MED ORDER — LEVOTHYROXINE SODIUM 88 MCG PO TABS: 88.0000 ug | ORAL_TABLET | Freq: Every day | ORAL | 1 refills | Status: DC

## 2022-04-27 MED ORDER — FLUTICASONE PROPIONATE 50 MCG/ACT NA SUSP: NASAL | 1 refills | Status: DC

## 2022-04-27 NOTE — Progress Notes (Signed)
Subjective:     Patient ID: Dana Underwood, female    DOB: 05-May-1981, 41 y.o.   MRN: [id number]  Chief Complaint  Patient presents with   Foot Pain    Pt c/o right foot big toe pain. Joint pain has been going on for years but more consistent but now it feels like a knot. Has tried ice when swelling. Stiff and achy.    Nasal Congestion    Would like nasal spray. Flonase.    Follow-up    Referral to Endo, Cant get in until end of December.     HPI: Allergic rhinitis:  pt reports having multiple allergies to environment and animal dander, year round symptoms with nasal drainage, congestion, sneezing, postnasal drainage. Reports using Flonase nasal spray with good results. Pain She reports chronic back pain. There was an injury that may have caused the pain. The pain started about a year ago and is gradually worsening. The pain does not radiate. The pain is described as aching, sharp, and soreness, occurring intermittently. 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 Seen by [facility], per MRI, she has thoracic bulging discs T7-T9. Lumbar with mild degenerative changes. Hypothyroidism: Patient presents today for followup of Hypothyroidism. pt also reports hx of thyroid nodules that she monitors annually with Endocrinology. Patient reports positive compliance with daily medication.  Patient denies any of the following symptoms: fatigue, cold intolerance, constipation, weight gain or inability to lose weight, muscle weakness, mental slowing, dry hair and skin. Right big toe pain:  pt is a yoga instructor, feeling more pain lately with increased class schedule, tenderness over distal metatarsal joint with possible bone spur, denies swelling, bruising or erythema.  Assessment & Plan:   Problem List Items Addressed This Visit       Endocrine   Acquired hypothyroidism - Primary    Chronic - stable with  sx, still waiting on ENDO appointment, not till Dec., she may try to go to [facility] in [place], will let me know if want to order thyroid scan beforehand to save time. Refilling Levothyroxine today. f/u prn.       Relevant Medications   levothyroxine (SYNTHROID) 88 MCG tablet     Other   Chronic back pain    Chronic - seen by ORTHO - MRI indicates 2 herniated discs in thoracic spine T7-T9, have recommended epidural injections, pt is considering. Continues to take Zanaflex prn.       Other Visit Diagnoses     Allergic rhinitis, unspecified seasonality, unspecified trigger       Relevant Medications   fluticasone (FLONASE) 50 MCG/ACT nasal spray   Chronic toe pain, right foot    - great toe, anterior pain, with possible bone spur palpable. pt feels  pain only with palpation or bending toe. Advised on icing tid, limiting activities that increase pain. Can try metatarsal pad.       Outpatient Medications Prior to Visit  Medication Sig Dispense Refill   albuterol (VENTOLIN HFA) 108 (90 Base) MCG/ACT inhaler SMARTSIG:1 Puff(s) Via Inhaler Every 6 Hours PRN     Cyanocobalamin (VITAMIN B 12 PO) Take by mouth.     Multiple Vitamin (MULTIVITAMIN) tablet Take 1 tablet by mouth daily.     Omega-3 Fatty Acids (FISH OIL PO) Take by mouth.     Probiotic Product (PROBIOTIC PO) Take by mouth.     tiZANidine (ZANAFLEX)  4 MG tablet Take 1 tablet (4 mg total) by mouth every 6 (six) hours as needed for muscle spasms. 30 tablet 0   UNABLE TO FIND Nutrafol     valACYclovir (VALTREX) 1000 MG tablet Take 2,000 mg by mouth 2 (two) times daily.     cetirizine (ZYRTEC) 10 MG tablet      L-LYSINE PO Take by mouth.     levothyroxine (SYNTHROID) 88 MCG tablet Take 88 mcg by mouth daily.     LORazepam (ATIVAN) 1 MG tablet Take 0.5-1 mg by mouth daily as needed.     magnesium 30 MG tablet Take 30 mg by mouth 2 (two) times daily.     Thiamine HCl (VITAMIN B-1 PO) Take by mouth.     No facility-administered  medications prior to visit.    Past Medical History:  Diagnosis Date   Allergy    Anxiety    Asthma    Depression    Endometriosis 2016   2019   Thyroid disease     No past surgical history on file.  Allergies  Allergen Reactions   Penicillins Dermatitis, Hives, Itching, Rash and Swelling      Objective:    Physical Exam Vitals and nursing note reviewed.  Constitutional:      Appearance: Normal appearance.  Cardiovascular:     Rate and Rhythm: Normal rate and regular rhythm.  Pulmonary:     Effort: Pulmonary effort is normal.     Breath sounds: Normal breath sounds.  Musculoskeletal:        General: Normal range of motion.     Right foot: Bony tenderness (great toe) present.  Skin:    General: Skin is warm and dry.  Neurological:     Mental Status: She is alert.  Psychiatric:        Mood and Affect: Mood normal.        Behavior: Behavior normal.    BP 98/62 (BP Location: Left Arm, Patient Position: Sitting, Cuff Size: Large)   Pulse 60   Temp 98.4 F (36.9 C) (Temporal)   Ht 5\' 7"  (1.702 m)   Wt 178 lb 2 oz (80.8 kg)   LMP 04/14/2022 (Exact Date)   SpO2 98%   BMI 27.90 kg/m  Wt Readings from Last 3 Encounters:  04/27/22 178 lb 2 oz (80.8 kg)  01/18/22 184 lb (83.5 kg)    Meds ordered this encounter  Medications   levothyroxine (SYNTHROID) 88 MCG tablet    Sig: Take 1 tablet (88 mcg total) by mouth daily.    Dispense:  90 tablet    Refill:  1   fluticasone (FLONASE) 50 MCG/ACT nasal spray    Sig: 1 spray each nostril twice a day for 2 weeks, then reduce to 1 spray each nostril daily.    Dispense:  16 g    Refill:  1    Order Specific Question:   Supervising Provider    Answer:   ANDY, CAMILLE L [[id number]]   *Extra time (01/20/22) spent with patient today which consisted of chart review, discussing diagnoses: toe pain, hypothyroidism, work up, treatment, answering questions, and documentation.  , NP

## 2022-04-27 NOTE — Assessment & Plan Note (Signed)
Chronic - stable with sx, still waiting on ENDO appointment, not till Dec., she may try to go to [facility] in [place], will let me know if want to order thyroid scan beforehand to save time. Refilling Levothyroxine today. f/u prn.

## 2022-04-27 NOTE — Assessment & Plan Note (Signed)
Chronic - seen by [facility] - MRI indicates 2 herniated discs in thoracic spine T7-T9, have recommended epidural injections, pt is considering. Continues to take Zanaflex prn.

## 2022-04-27 NOTE — Patient Instructions (Addendum)
It was very nice to see you today!   I have sent your Levothyroxine and Flonase refills to your pharmacy. Let me know if you want me to order a thyroid scan prior to your Endocrinology appt. Let me know if your toe pain worsens and you want to have an xray and/or podiatry referral.  Have a great weekend!     PLEASE NOTE:  If you had any lab tests please let us know if you have not heard back within a few days. You may see your results on MyChart before we have a chance to review them but we will give you a call once they are reviewed by [place]. If we ordered any referrals today, please let us know if you have not heard from their office within the next week.

## 2022-05-03 ENCOUNTER — Telehealth: Payer: Self-pay | Admitting: Physical Medicine and Rehabilitation

## 2022-05-03 NOTE — Telephone Encounter (Signed)
Patient called. She would like injection in her back. Her call back number is [phone]

## 2022-05-15 ENCOUNTER — Ambulatory Visit (INDEPENDENT_AMBULATORY_CARE_PROVIDER_SITE_OTHER): Payer: Commercial Managed Care - PPO | Admitting: Family

## 2022-05-15 ENCOUNTER — Encounter: Payer: Self-pay | Admitting: Family

## 2022-05-15 VITALS — BP 109/75 | HR 60 | Temp 98.4°F | Ht 67.0 in | Wt 179.4 lb

## 2022-05-15 DIAGNOSIS — Z0000 Encounter for general adult medical examination without abnormal findings: Secondary | ICD-10-CM

## 2022-05-15 LAB — CBC WITH DIFFERENTIAL/PLATELET
Basophils Absolute: 0 10*3/uL (ref 0.0–0.1)
Basophils Relative: 0.6 % (ref 0.0–3.0)
Eosinophils Absolute: 0.3 10*3/uL (ref 0.0–0.7)
Eosinophils Relative: 5.8 % — ABNORMAL HIGH (ref 0.0–5.0)
HCT: 37.3 % (ref 36.0–46.0)
Hemoglobin: 12.3 g/dL (ref 12.0–15.0)
Lymphocytes Relative: 34.9 % (ref 12.0–46.0)
Lymphs Abs: 1.9 10*3/uL (ref 0.7–4.0)
MCHC: 33.1 g/dL (ref 30.0–36.0)
MCV: 88.9 fl (ref 78.0–100.0)
Monocytes Absolute: 0.4 10*3/uL (ref 0.1–1.0)
Monocytes Relative: 6.9 % (ref 3.0–12.0)
Neutro Abs: 2.8 10*3/uL (ref 1.4–7.7)
Neutrophils Relative %: 51.8 % (ref 43.0–77.0)
Platelets: 284 10*3/uL (ref 150.0–400.0)
RBC: 4.19 Mil/uL (ref 3.87–5.11)
RDW: 13.1 % (ref 11.5–15.5)
WBC: 5.4 10*3/uL (ref 4.0–10.5)

## 2022-05-15 LAB — COMPREHENSIVE METABOLIC PANEL
ALT: 15 U/L (ref 0–35)
AST: 14 U/L (ref 0–37)
Albumin: 4.4 g/dL (ref 3.5–5.2)
Alkaline Phosphatase: 39 U/L (ref 39–117)
BUN: 14 mg/dL (ref 6–23)
CO2: 27 mEq/L (ref 19–32)
Calcium: 9.8 mg/dL (ref 8.4–10.5)
Chloride: 105 mEq/L (ref 96–112)
Creatinine, Ser: 0.86 mg/dL (ref 0.40–1.20)
GFR: 83.67 mL/min (ref >60.00)
Glucose, Bld: 90 mg/dL (ref 70–99)
Potassium: 4.6 mEq/L (ref 3.5–5.1)
Sodium: 139 mEq/L (ref 135–145)
Total Bilirubin: 0.4 mg/dL (ref 0.2–1.2)
Total Protein: 6.9 g/dL (ref 6.0–8.3)

## 2022-05-15 LAB — LIPID PANEL
Cholesterol: 141 mg/dL (ref 0–200)
HDL: 57.6 mg/dL (ref >39.00)
LDL Cholesterol: 76 mg/dL (ref 0–99)
NonHDL: 83.72
Total CHOL/HDL Ratio: 2
Triglycerides: 40 mg/dL (ref 0.0–149.0)
VLDL: 8 mg/dL (ref 0.0–40.0)

## 2022-05-15 NOTE — Patient Instructions (Signed)
It was very nice to see you today!  Go to the lab for blood work today.   Have a great week :-)   PLEASE NOTE:  If you had any lab tests please let us know if you have not heard back within a few days. You may see your results on MyChart before we have a chance to review them but we will give you a call once they are reviewed by [place]. If we ordered any referrals today, please let us know if you have not heard from their office within the next week.   Please try these tips to maintain a healthy lifestyle:  Eat most of your calories during the day when you are active. Eliminate processed foods including packaged sweets (pies, cakes, cookies), reduce intake of potatoes, white bread, white pasta, and white rice. Look for whole grain options, oat flour or almond flour.  Each meal should contain half fruits/vegetables, one quarter protein, and one quarter carbs (no bigger than a computer mouse).  Cut down on sweet beverages. This includes juice, soda, and sweet tea. Also watch fruit intake, though this is a healthier sweet option, it still contains natural sugar! Limit to 3 servings daily.  Drink at least 1 glass of water with each meal and aim for at least 8 glasses per day  Exercise at least 150 minutes every week.

## 2022-05-15 NOTE — Progress Notes (Signed)
Phone [phone]   Subjective:   Patient is a 41 y.o. female presenting for annual physical.    Chief Complaint  Patient presents with   Annual Exam    Fasting W/ Labs    See problem oriented charting- ROS- full  review of systems was completed and negative.   The following were reviewed and entered/updated in epic: Past Medical History:  Diagnosis Date   Allergy    Anxiety    Asthma    Depression    Endometriosis 2016   2019   Thyroid disease    Patient Active Problem List   Diagnosis Date Noted   Chronic back pain 01/22/2022   Acquired hypothyroidism 01/18/2022   Multiple thyroid nodules 01/18/2022   Generalized anxiety disorder 01/18/2022   No past surgical history on file.  Family History  Problem Relation Age of Onset   Cancer Mother    Depression Mother    Early death Mother    Anxiety disorder Father    Depression Father    Hypertension Father    Vision loss Father    Cancer Maternal Grandfather    Heart disease Maternal Grandmother    Hypertension Maternal Grandmother    Cancer Paternal Grandfather    Hypertension Paternal Grandmother    Stroke Paternal Grandmother    ADD / ADHD Brother    Anxiety disorder Brother    Depression Brother     Medications- reviewed and updated Current Outpatient Medications  Medication Sig Dispense Refill   albuterol (VENTOLIN HFA) 108 (90 Base) MCG/ACT inhaler SMARTSIG:1 Puff(s) Via Inhaler Every 6 Hours PRN     Cyanocobalamin (VITAMIN B 12 PO) Take by mouth.     fluticasone (FLONASE) 50 MCG/ACT nasal spray 1 spray each nostril twice a day for 2 weeks, then reduce to 1 spray each nostril daily. 16 g 1   levothyroxine (SYNTHROID) 88 MCG tablet Take 1 tablet (88 mcg total) by mouth daily. 90 tablet 1   Multiple Vitamin (MULTIVITAMIN) tablet Take 1 tablet by mouth daily.     Omega-3 Fatty Acids (FISH OIL PO) Take by mouth.     Probiotic Product (PROBIOTIC PO) Take by mouth.     tiZANidine (ZANAFLEX) 4 MG tablet  Take 1 tablet (4 mg total) by mouth every 6 (six) hours as needed for muscle spasms. 30 tablet 0   valACYclovir (VALTREX) 1000 MG tablet Take 2,000 mg by mouth 2 (two) times daily.     No current facility-administered medications for this visit.    Allergies-reviewed and updated Allergies  Allergen Reactions   Penicillins Dermatitis, Hives, Itching, Rash and Swelling    Social History   Social History Narrative   Not on file   Objective  Objective:  BP 109/75 (BP Location: Left Arm, Patient Position: Sitting, Cuff Size: Large)   Pulse 60   Temp 98.4 F (36.9 C) (Temporal)   Ht 5\' 7"  (1.702 m)   Wt 179 lb 6 oz (81.4 kg)   LMP 05/12/2022 (Exact Date)   SpO2 100%   BMI 28.09 kg/m  Physical Exam Vitals and nursing note reviewed.  Constitutional:      Appearance: Normal appearance.  HENT:     Head: Normocephalic.     Right Ear: Tympanic membrane normal.     Left Ear: Tympanic membrane normal.     Nose: Nose normal.     Mouth/Throat:     Mouth: Mucous membranes are moist.  Eyes:     Pupils: Pupils are equal, round,  and reactive to light.  Cardiovascular:     Rate and Rhythm: Normal rate and regular rhythm.  Pulmonary:     Effort: Pulmonary effort is normal.     Breath sounds: Normal breath sounds.  Musculoskeletal:        General: Normal range of motion.     Cervical back: Normal range of motion.  Lymphadenopathy:     Cervical: No cervical adenopathy.  Skin:    General: Skin is warm and dry.  Neurological:     Mental Status: She is alert.  Psychiatric:        Mood and Affect: Mood normal.        Behavior: Behavior normal.       Assessment and Plan   Health Maintenance counseling: 1. Anticipatory guidance: Patient counseled regarding regular dental exams q6 months, eye exams,  avoiding smoking and second hand smoke, limiting alcohol to 1 beverage per day, no illicit drugs.   2. Risk factor reduction:  Advised patient of need for regular exercise and diet  rich with fruits and vegetables to reduce risk of heart attack and stroke.   Wt Readings from Last 3 Encounters:  05/15/22 179 lb 6 oz (81.4 kg)  04/27/22 178 lb 2 oz (80.8 kg)  01/18/22 184 lb (83.5 kg)   3. Immunizations/screenings/ancillary studies  There is no immunization history on file for this patient. Health Maintenance Due  Topic Date Due   COVID-19 Vaccine (1) Never done   HIV Screening  Never done   Hepatitis C Screening  Never done   TETANUS/TDAP  Never done    4. Cervical cancer screening- done - normal 5. Breast cancer screening-  mammogram done this year 6. Colon cancer screening - N/A 7. Skin cancer screening- advised regular sunscreen use. Denies worrisome, changing, or new skin lesions.  8. Birth control/STD check- none, no opposed to getting pregnant 9. Osteoporosis screening- N/A 10. Alcohol screening: rarely 11. Smoking associated screening (lung cancer screening, AAA screen 65-75, UA)- non smoker  Problem List Items Addressed This Visit   None Visit Diagnoses     Annual physical exam    -  Primary   Relevant Orders   Comprehensive metabolic panel   TSH   Lipid panel   CBC with Differential/Platelet      Recommended follow up: Return for any future concerns. No future appointments.  Lab/Order associations:fasting   Dulce Sellar, NP

## 2022-06-27 ENCOUNTER — Ambulatory Visit: Payer: Commercial Managed Care - PPO | Admitting: Family

## 2022-06-27 ENCOUNTER — Encounter: Payer: Self-pay | Admitting: Family

## 2022-06-27 VITALS — BP 123/74 | HR 68 | Temp 99.4°F | Ht 67.0 in | Wt 178.1 lb

## 2022-06-27 DIAGNOSIS — F411 Generalized anxiety disorder: Secondary | ICD-10-CM | POA: Diagnosis not present

## 2022-06-27 MED ORDER — ESCITALOPRAM OXALATE 5 MG PO TABS: 5.0000 mg | ORAL_TABLET | ORAL | 2 refills | Status: DC

## 2022-06-27 MED ORDER — LORAZEPAM 1 MG PO TABS: 0.5000 mg | ORAL_TABLET | Freq: Every day | ORAL | 1 refills | Status: DC | PRN

## 2022-06-27 NOTE — Assessment & Plan Note (Addendum)
   chronic  noticing more sx around 2 weeks prior to her menses, when cycle is going on the anxiety goes away, then returns when cycle is over.  does not want to take hormones  reluctant to take SSRIs, has had in past and felt "nothing" but does not like the anxiety  sending low dose Lexapro can take for 3 weeks before & after her cycle and take 1 week off if preferred.  also refilling lorazepam, takes this prn, not daily, PDMP checked & verifed  f/u prn

## 2022-06-27 NOTE — Patient Instructions (Signed)
It was very nice to see you today!   I have sent over the lowest dose of Lexapro to your pharmacy.   Take 2 weeks prior to your menstrual cycle, during cycle, and 1 week after, & ok to stop for 1 week or you can continue every day.  Can send me a message via MyChart to let me know how you are doing after your first month.   PLEASE NOTE:  If you had any lab tests please let us know if you have not heard back within a few days. You may see your results on MyChart before we have a chance to review them but we will give you a call once they are reviewed by [place]. If we ordered any referrals today, please let us know if you have not heard from their office within the next week.

## 2022-06-27 NOTE — Progress Notes (Signed)
Patient ID: Dana Underwood, female    DOB: 09-21-1981, 41 y.o.   MRN: [id number]  Chief Complaint  Patient presents with   Panic Attack    Pt states her anxiety is becoming worse a week or 2 worse before her cycle is about to come. Pt c/o stressing about stuff that is completely out of her control. A lot of worrying a lot and having panic attacks. Currently taking Lorazepam as needed, But would like to be on something daily.     HPI: Anxiety: Patient complains of worsening anxiety.   She has the following symptoms: difficulty concentrating, feelings of losing control, irritable, palpitations, racing thoughts, sweating.  Onset of symptoms was approximately  years ago, She denies current suicidal and homicidal ideation.  Possible organic causes contributing are: endocrine/metabolic, hormonal r/t her menses.  Previous treatment includes  many different SSRIs,Buspar, OCPs, IUD,  all had SE she did not like.  and individual therapy.    Assessment & Plan:   Problem List Items Addressed This Visit       Other   Generalized anxiety disorder - Primary    chronic noticing more sx around 2 weeks prior to her menses, when cycle is going on the anxiety goes away, then returns when cycle is over. does not want to take hormones reluctant to take SSRIs, has had in past and felt "nothing" but does not like the anxiety sending low dose Lexapro can take for 3 weeks before & after her cycle and take 1 week off if preferred. also refilling lorazepam, takes this prn, not daily, PDMP checked & verifed f/u prn      Relevant Medications   LORazepam (ATIVAN) 1 MG tablet   escitalopram (LEXAPRO) 5 MG tablet    Subjective:    Outpatient Medications Prior to Visit  Medication Sig Dispense Refill   albuterol (VENTOLIN HFA) 108 (90 Base) MCG/ACT inhaler SMARTSIG:1 Puff(s) Via Inhaler Every 6 Hours PRN     Cyanocobalamin (VITAMIN B 12 PO) Take by mouth.     fluticasone (FLONASE) 50 MCG/ACT nasal spray 1  spray each nostril twice a day for 2 weeks, then reduce to 1 spray each nostril daily. 16 g 1   levothyroxine (SYNTHROID) 88 MCG tablet Take 1 tablet (88 mcg total) by mouth daily. 90 tablet 1   Multiple Vitamin (MULTIVITAMIN) tablet Take 1 tablet by mouth daily.     Omega-3 Fatty Acids (FISH OIL PO) Take by mouth.     Probiotic Product (PROBIOTIC PO) Take by mouth.     tiZANidine (ZANAFLEX) 4 MG tablet Take 1 tablet (4 mg total) by mouth every 6 (six) hours as needed for muscle spasms. 30 tablet 0   valACYclovir (VALTREX) 1000 MG tablet Take 2,000 mg by mouth 2 (two) times daily.     LORazepam (ATIVAN) 1 MG tablet TAKE 1/2 TO 1 TABLET DAILY AS NEEDED     No facility-administered medications prior to visit.   Past Medical History:  Diagnosis Date   Allergy    Anxiety    Asthma    Depression    Endometriosis 2016   2019   Thyroid disease    No past surgical history on file. Allergies  Allergen Reactions   Penicillins Dermatitis, Hives, Itching, Rash and Swelling      Objective:    Physical Exam Vitals and nursing note reviewed.  Constitutional:      Appearance: Normal appearance.  Cardiovascular:     Rate and Rhythm: Normal rate  and regular rhythm.  Pulmonary:     Effort: Pulmonary effort is normal.     Breath sounds: Normal breath sounds.  Musculoskeletal:        General: Normal range of motion.  Skin:    General: Skin is warm and dry.  Neurological:     Mental Status: She is alert.  Psychiatric:        Mood and Affect: Mood normal.        Behavior: Behavior normal.    BP 123/74 (BP Location: Left Arm, Patient Position: Sitting, Cuff Size: Large)   Pulse 68   Temp 99.4 F (37.4 C) (Temporal)   Ht 5\' 7"  (1.702 m)   Wt 178 lb 2 oz (80.8 kg)   LMP 06/10/2022 (Exact Date)   SpO2 97%   BMI 27.90 kg/m  Wt Readings from Last 3 Encounters:  06/27/22 178 lb 2 oz (80.8 kg)  05/15/22 179 lb 6 oz (81.4 kg)  04/27/22 178 lb 2 oz (80.8 kg)       06/27/22,  NP

## 2022-07-09 ENCOUNTER — Encounter: Payer: Self-pay | Admitting: Family

## 2022-07-26 ENCOUNTER — Other Ambulatory Visit: Payer: Self-pay

## 2022-07-26 DIAGNOSIS — F411 Generalized anxiety disorder: Secondary | ICD-10-CM

## 2022-07-26 MED ORDER — ESCITALOPRAM OXALATE 10 MG PO TABS: 10.0000 mg | ORAL_TABLET | ORAL | 1 refills | Status: DC

## 2022-08-15 ENCOUNTER — Telehealth: Payer: Commercial Managed Care - PPO | Admitting: Family

## 2022-08-16 ENCOUNTER — Encounter: Payer: Self-pay | Admitting: Family

## 2022-08-16 ENCOUNTER — Telehealth (INDEPENDENT_AMBULATORY_CARE_PROVIDER_SITE_OTHER): Payer: Commercial Managed Care - PPO | Admitting: Family

## 2022-08-16 DIAGNOSIS — F411 Generalized anxiety disorder: Secondary | ICD-10-CM | POA: Diagnosis not present

## 2022-08-16 MED ORDER — ESCITALOPRAM OXALATE 10 MG PO TABS: 10.0000 mg | ORAL_TABLET | Freq: Every day | ORAL | 1 refills | Status: DC

## 2022-08-16 NOTE — Progress Notes (Signed)
MyChart Video Visit    Virtual Visit via Video Note   This format is felt to be most appropriate for this patient at this time. Physical exam was limited by quality of the video and audio technology used for the visit. CMA was able to get the patient set up on a video visit.  Patient location: Home. Patient and provider in visit Provider location: Office  I discussed the limitations of evaluation and management by telemedicine and the availability of in person appointments. The patient expressed understanding and agreed to proceed.  Visit Date: 08/16/2022  Today's healthcare provider: Jeanie Sewer, NP     Subjective:   Patient ID: Dana Underwood, female    DOB: October 23, 1981, 41 y.o.   MRN: [id number]  Chief Complaint  Patient presents with   Anxiety    Refill    HPI Anxiety: Patient complains of worsening anxiety.   She has the following symptoms: difficulty concentrating, feelings of losing control, irritable, palpitations, racing thoughts, sweating.  Onset of symptoms was approximately  years ago, She denies current suicidal and homicidal ideation.  Possible organic causes contributing are: endocrine/metabolic, hormonal r/t her menses.  Previous treatment includes many different SSRIs,Buspar, OCPs, IUD,  all had SE she did not like. and individual therapy.    Assessment & Plan:   Problem List Items Addressed This Visit       Other   Generalized anxiety disorder    Chronic started Lexapro and pt reports doing good with this also reports finding a good therapist which has helped refilling Lexapro f/u in 6 mos or prn      Relevant Medications   escitalopram (LEXAPRO) 10 MG tablet    Past Medical History:  Diagnosis Date   Allergy    Anxiety    Asthma    Depression    Endometriosis 2016   2019   Thyroid disease     No past surgical history on file.  Outpatient Medications Prior to Visit  Medication Sig Dispense Refill   albuterol (VENTOLIN  HFA) 108 (90 Base) MCG/ACT inhaler SMARTSIG:1 Puff(s) Via Inhaler Every 6 Hours PRN     Cyanocobalamin (VITAMIN B 12 PO) Take by mouth.     fluticasone (FLONASE) 50 MCG/ACT nasal spray 1 spray each nostril twice a day for 2 weeks, then reduce to 1 spray each nostril daily. 16 g 1   levothyroxine (SYNTHROID) 88 MCG tablet Take 1 tablet (88 mcg total) by mouth daily. 90 tablet 1   LORazepam (ATIVAN) 1 MG tablet Take 0.5-1 tablets (0.5-1 mg total) by mouth daily as needed for anxiety. 30 tablet 1   Multiple Vitamin (MULTIVITAMIN) tablet Take 1 tablet by mouth daily.     Omega-3 Fatty Acids (FISH OIL PO) Take by mouth.     Probiotic Product (PROBIOTIC PO) Take by mouth.     tiZANidine (ZANAFLEX) 4 MG tablet Take 1 tablet (4 mg total) by mouth every 6 (six) hours as needed for muscle spasms. 30 tablet 0   valACYclovir (VALTREX) 1000 MG tablet Take 2,000 mg by mouth 2 (two) times daily.     escitalopram (LEXAPRO) 10 MG tablet Take 1 tablet (10 mg total) by mouth as directed. Take 2 weeks prior to your menstrual cycle, during cycle, and 1 week after, ok to stop for 1 week or continue every day. 30 tablet 1   No facility-administered medications prior to visit.    Allergies  Allergen Reactions   Penicillins Dermatitis, Hives, Itching, Rash  and Swelling       Objective:   Physical Exam Vitals and nursing note reviewed.  Constitutional:      General: She is not in acute distress.    Appearance: Normal appearance.  HENT:     Head: Normocephalic.  Pulmonary:     Effort: No respiratory distress.  Musculoskeletal:     Cervical back: Normal range of motion.  Skin:    General: Skin is dry.     Coloration: Skin is not pale.  Neurological:     Mental Status: She is alert and oriented to person, place, and time.  Psychiatric:        Mood and Affect: Mood normal.   Ht 5\' 7"  (1.702 m)   Wt 178 lb (80.7 kg)   LMP 08/01/2022   BMI 27.88 kg/m   Wt Readings from Last 3 Encounters:  08/16/22  178 lb (80.7 kg)  06/27/22 178 lb 2 oz (80.8 kg)  05/15/22 179 lb 6 oz (81.4 kg)      I discussed the assessment and treatment plan with the patient. The patient was provided an opportunity to ask questions and all were answered. The patient agreed with the plan and demonstrated an understanding of the instructions.   The patient was advised to call back or seek an in-person evaluation if the symptoms worsen or if the condition fails to improve as anticipated.  Jeanie Sewer, NP [place] [phone] (phone) [phone] (fax)  [place]

## 2022-08-16 NOTE — Assessment & Plan Note (Addendum)
.   Chronic . started Lexapro and pt reports doing good with this . also reports finding a good therapist which has helped . refilling Lexapro . f/u in 6 mos or prn

## 2022-08-20 ENCOUNTER — Encounter: Payer: Self-pay | Admitting: *Deleted

## 2022-09-17 ENCOUNTER — Encounter: Payer: Self-pay | Admitting: Family

## 2022-09-17 DIAGNOSIS — F411 Generalized anxiety disorder: Secondary | ICD-10-CM

## 2022-09-18 MED ORDER — FLUOXETINE HCL 10 MG PO CAPS: 10.0000 mg | ORAL_CAPSULE | Freq: Every day | ORAL | 2 refills | Status: DC

## 2022-09-19 ENCOUNTER — Encounter: Payer: Self-pay | Admitting: Family

## 2022-10-03 ENCOUNTER — Telehealth: Payer: Self-pay | Admitting: Family

## 2022-10-03 ENCOUNTER — Encounter: Payer: Self-pay | Admitting: Family

## 2022-10-03 ENCOUNTER — Ambulatory Visit (INDEPENDENT_AMBULATORY_CARE_PROVIDER_SITE_OTHER): Payer: Commercial Managed Care - PPO | Admitting: Family

## 2022-10-03 VITALS — BP 122/78 | HR 78 | Temp 97.5°F | Ht 67.0 in | Wt 191.1 lb

## 2022-10-03 DIAGNOSIS — F900 Attention-deficit hyperactivity disorder, predominantly inattentive type: Secondary | ICD-10-CM | POA: Diagnosis not present

## 2022-10-03 DIAGNOSIS — E039 Hypothyroidism, unspecified: Secondary | ICD-10-CM | POA: Diagnosis not present

## 2022-10-03 LAB — T4, FREE: Free T4: 1.13 ng/dL (ref 0.60–1.60)

## 2022-10-03 LAB — TSH: TSH: 0.41 u[IU]/mL (ref 0.35–5.50)

## 2022-10-03 MED ORDER — LISDEXAMFETAMINE DIMESYLATE 20 MG PO CAPS: 20.0000 mg | ORAL_CAPSULE | Freq: Every day | ORAL | 0 refills | Status: DC

## 2022-10-03 NOTE — Assessment & Plan Note (Signed)
New pt tested with her therapist, but not in chart and she did not bring a copy provided scale to complete discussed meds and non-med strategies to deal with sx pt would like to try generic Vyvanse, sending 20mg , advised on use & SE f/u in 1 month

## 2022-10-03 NOTE — Telephone Encounter (Signed)
Patient states: -Had an OV with PCP and was told that if there are any problems with vyvanse being filled, to let her know - Pharmacy informed her that a certain form needs to be filled out by PCP so a prior authorization process can be started  - Pharmacist informed her that they faxed the form over on 11/08 and if it is completed by EOD 11/08, there is a chance she can get medication today   [place] has [place] via [place] and placed in provider's box.

## 2022-10-03 NOTE — Assessment & Plan Note (Addendum)
chronic taking Levo qd having new sx today, has appt with ENDO in Dec. but would like labs today discussed increasing dose slightly if her T4 is still on lower end of normal. f/u 6 mos.

## 2022-10-03 NOTE — Progress Notes (Signed)
Patient ID: Dana Underwood, female    DOB: Apr 29, 1981, 41 y.o.   MRN: [id number]  Chief Complaint  Patient presents with   Hypothyroidism    Pt would like Thyroid levels checked due to dry skin, Hair shedding and menstrual late for 2 months. Endocrinology appt is in Dec. 21.   ADHD    Discuss medications     HPI:      ADHD:  having trouble with organizing, easily distracted, difficulty focusing. Reports having similar sx in college, but never tested for ADHD, she just pushed through and worked/studied much longer than friends. Reports seeing therapist who tested her for ADHD d/t her continued anxiety sx and she had a high score and referred her to [facility], but she wanted to discuss meds today.   Hypothyroidism: Patient presents today for followup of Hypothyroidism.  Patient reports positive compliance with daily medication, qd. Patient denies any of the following symptoms: fatigue, cold intolerance, constipation, weight gain or inability to lose weight, muscle weakness, mental slowing, does report dry & thinning hair and skin. pt also reports new irregular menstrual cycles. Last TSH and free T4: Lab Results  Component Value Date   FREE T4 1.13 10/03/2022   FREE T4 1.02 01/18/2022   TSH 0.41 10/03/2022   TSH 1.10 01/18/2022     Assessment & Plan:   Problem List Items Addressed This Visit       Endocrine   Acquired hypothyroidism - Primary    chronic taking Levo qd having new sx today, has appt with ENDO in Dec. but would like labs today discussed increasing dose slightly if her T4 is still on lower end of normal. f/u 6 mos.       Relevant Orders   T4, free (Completed)   TSH (Completed)     Other   ADHD (attention deficit hyperactivity disorder), inattentive type    New pt tested with her therapist, but not in chart and she did not bring a copy provided scale to complete discussed meds and non-med strategies to deal with sx pt would like to try generic  Vyvanse, sending 20mg , advised on use & SE f/u in 1 month      Relevant Medications   lisdexamfetamine (VYVANSE) 20 MG capsule    Subjective:    Outpatient Medications Prior to Visit  Medication Sig Dispense Refill   albuterol (VENTOLIN HFA) 108 (90 Base) MCG/ACT inhaler SMARTSIG:1 Puff(s) Via Inhaler Every 6 Hours PRN     Cyanocobalamin (VITAMIN B 12 PO) Take by mouth.     FLUoxetine (PROZAC) 10 MG capsule Take 1 capsule (10 mg total) by mouth daily. 30 capsule 2   fluticasone (FLONASE) 50 MCG/ACT nasal spray 1 spray each nostril twice a day for 2 weeks, then reduce to 1 spray each nostril daily. 16 g 1   levothyroxine (SYNTHROID) 88 MCG tablet Take 1 tablet (88 mcg total) by mouth daily. 90 tablet 1   LORazepam (ATIVAN) 1 MG tablet Take 0.5-1 tablets (0.5-1 mg total) by mouth daily as needed for anxiety. 30 tablet 1   Multiple Vitamin (MULTIVITAMIN) tablet Take 1 tablet by mouth daily.     Omega-3 Fatty Acids (FISH OIL PO) Take by mouth.     Probiotic Product (PROBIOTIC PO) Take by mouth.     tiZANidine (ZANAFLEX) 4 MG tablet Take 1 tablet (4 mg total) by mouth every 6 (six) hours as needed for muscle spasms. 30 tablet 0   valACYclovir (VALTREX) 1000 MG  tablet Take 2,000 mg by mouth 2 (two) times daily.     No facility-administered medications prior to visit.   Past Medical History:  Diagnosis Date   Allergy    Anxiety    Asthma    Depression    Endometriosis 2016   2019   Thyroid disease    No past surgical history on file. Allergies  Allergen Reactions   Penicillins Dermatitis, Hives, Itching, Rash and Swelling      Objective:    Physical Exam Vitals and nursing note reviewed.  Constitutional:      Appearance: Normal appearance.  Cardiovascular:     Rate and Rhythm: Normal rate and regular rhythm.  Pulmonary:     Effort: Pulmonary effort is normal.     Breath sounds: Normal breath sounds.  Musculoskeletal:        General: Normal range of motion.  Skin:     General: Skin is warm and dry.  Neurological:     Mental Status: She is alert.  Psychiatric:        Mood and Affect: Mood normal.        Behavior: Behavior normal.    BP 122/78 (BP Location: Left Arm, Patient Position: Sitting, Cuff Size: Large)   Pulse 78   Temp (!) 97.5 F (36.4 C) (Temporal)   Ht 5\' 7"  (1.702 m)   Wt 191 lb 2 oz (86.7 kg)   LMP 08/30/2022 (Exact Date)   SpO2 97%   BMI 29.93 kg/m  Wt Readings from Last 3 Encounters:  10/03/22 191 lb 2 oz (86.7 kg)  08/16/22 178 lb (80.7 kg)  06/27/22 178 lb 2 oz (80.8 kg)       08/27/22, NP

## 2022-10-04 NOTE — Telephone Encounter (Signed)
PA submitted and Approved on 10/04/22,  Pt is aware.  (Key: [id number])

## 2022-10-07 NOTE — Progress Notes (Signed)
Thyroid level is close to level last time. I am ok with increasing your dose slightly if you want to see if this helps your symptoms. Just let me know!

## 2022-10-08 ENCOUNTER — Encounter: Payer: Self-pay | Admitting: Family

## 2022-10-11 ENCOUNTER — Encounter: Payer: Self-pay | Admitting: Family

## 2022-10-11 DIAGNOSIS — F900 Attention-deficit hyperactivity disorder, predominantly inattentive type: Secondary | ICD-10-CM

## 2022-10-26 ENCOUNTER — Encounter: Payer: Self-pay | Admitting: Family

## 2022-10-26 ENCOUNTER — Ambulatory Visit: Payer: Commercial Managed Care - PPO | Admitting: Family

## 2022-10-26 VITALS — BP 112/78 | HR 75 | Temp 97.3°F | Ht 67.0 in | Wt 192.0 lb

## 2022-10-26 DIAGNOSIS — F900 Attention-deficit hyperactivity disorder, predominantly inattentive type: Secondary | ICD-10-CM

## 2022-10-26 MED ORDER — LISDEXAMFETAMINE DIMESYLATE 30 MG PO CAPS: 30.0000 mg | ORAL_CAPSULE | Freq: Every day | ORAL | 0 refills | Status: DC

## 2022-10-26 NOTE — Progress Notes (Signed)
Patient ID: Dana Underwood, female    DOB: [birth year], 41 y.o.   MRN: [id number]  Chief Complaint  Patient presents with   ADHD    Discuss increase medication dosage     HPI: ADHD:  having trouble with organizing, easily distracted, difficulty focusing. Reports having similar sx in college, but never tested for ADHD, she just pushed through and worked/studied much longer than friends. Reports seeing therapist who tested her for ADHD d/t her continued anxiety sx. Started Vyvanse 20mg  last visit.  Assessment & Plan:   Problem List Items Addressed This Visit       Other   ADHD (attention deficit hyperactivity disorder), inattentive type - Primary    New started Vyvanse 20mg , reports no SE but not at goal for sx sending 30mg  qam  pt will message or call & let me know next month if dose is high enough or needing another increase. f/u in 3 month      Relevant Medications   lisdexamfetamine (VYVANSE) 30 MG capsule    Subjective:    Outpatient Medications Prior to Visit  Medication Sig Dispense Refill   albuterol (VENTOLIN HFA) 108 (90 Base) MCG/ACT inhaler SMARTSIG:1 Puff(s) Via Inhaler Every 6 Hours PRN     Cyanocobalamin (VITAMIN B 12 PO) Take by mouth.     FLUoxetine (PROZAC) 10 MG capsule Take 1 capsule (10 mg total) by mouth daily. 30 capsule 2   fluticasone (FLONASE) 50 MCG/ACT nasal spray 1 spray each nostril twice a day for 2 weeks, then reduce to 1 spray each nostril daily. 16 g 1   levothyroxine (SYNTHROID) 88 MCG tablet Take 1 tablet (88 mcg total) by mouth daily. 90 tablet 1   LORazepam (ATIVAN) 1 MG tablet Take 0.5-1 tablets (0.5-1 mg total) by mouth daily as needed for anxiety. 30 tablet 1   Multiple Vitamin (MULTIVITAMIN) tablet Take 1 tablet by mouth daily.     Omega-3 Fatty Acids (FISH OIL PO) Take by mouth.     Probiotic Product (PROBIOTIC PO) Take by mouth.     tiZANidine (ZANAFLEX) 4 MG tablet Take 1 tablet (4 mg total) by mouth every 6 (six) hours as needed  for muscle spasms. 30 tablet 0   valACYclovir (VALTREX) 1000 MG tablet Take 2,000 mg by mouth 2 (two) times daily.     lisdexamfetamine (VYVANSE) 20 MG capsule Take 1 capsule (20 mg total) by mouth daily. 30 capsule 0   No facility-administered medications prior to visit.   Past Medical History:  Diagnosis Date   Allergy    Anxiety    Asthma    Depression    Endometriosis 2016   2019   Thyroid disease    No past surgical history on file. Allergies  Allergen Reactions   Penicillins Dermatitis, Hives, Itching, Rash and Swelling      Objective:    Physical Exam Vitals and nursing note reviewed.  Constitutional:      Appearance: Normal appearance.  Cardiovascular:     Rate and Rhythm: Normal rate and regular rhythm.  Pulmonary:     Effort: Pulmonary effort is normal.     Breath sounds: Normal breath sounds.  Musculoskeletal:        General: Normal range of motion.  Skin:    General: Skin is warm and dry.  Neurological:     Mental Status: She is alert.  Psychiatric:        Mood and Affect: Mood normal.  Behavior: Behavior normal.   BP 112/78 (BP Location: Left Arm, Patient Position: Sitting, Cuff Size: Normal)   Pulse 75   Temp (!) 97.3 F (36.3 C) (Temporal)   Ht 5\' 7"  (1.702 m)   Wt 192 lb (87.1 kg)   LMP 10/24/2022 (Exact Date)   SpO2 97%   BMI 30.07 kg/m  Wt Readings from Last 3 Encounters:  10/26/22 192 lb (87.1 kg)  10/03/22 191 lb 2 oz (86.7 kg)  08/16/22 178 lb (80.7 kg)       08/18/22, NP

## 2022-10-26 NOTE — Assessment & Plan Note (Addendum)
New started Vyvanse 20mg , reports no SE but not at goal for sx sending 30mg  qam  pt will message or call & let me know next month if dose is high enough or needing another increase. f/u in 3 month

## 2022-10-28 ENCOUNTER — Other Ambulatory Visit: Payer: Self-pay | Admitting: Family

## 2022-10-28 DIAGNOSIS — E039 Hypothyroidism, unspecified: Secondary | ICD-10-CM

## 2022-10-30 ENCOUNTER — Telehealth: Payer: Self-pay | Admitting: Family

## 2022-10-30 NOTE — Telephone Encounter (Signed)
Patient states lisdexamfetamine (VYVANSE) 30 MG capsule is not in stock at her regular pharmacy, [facility]. States they informed her they only have enough for a 10 day supply.   States [facility] find that [facility] [address] [place], [place] [address] has the medication.

## 2022-10-31 MED ORDER — LISDEXAMFETAMINE DIMESYLATE 30 MG PO CAPS: 30.0000 mg | ORAL_CAPSULE | Freq: Every day | ORAL | 0 refills | Status: DC

## 2022-10-31 NOTE — Telephone Encounter (Signed)
Patient states: - [facility] [place] [facility] that they no longer have the lisdexamfetamine (VYVANSE) 30 MG capsule  in stock anymore - [facility] [address] [place], [place] [address] does have it in stock   Patient would like this sent in asap before too many people get it refilled. She would also like a callback once this has been filled.

## 2022-11-02 ENCOUNTER — Telehealth: Payer: Commercial Managed Care - PPO | Admitting: Family

## 2022-11-22 ENCOUNTER — Other Ambulatory Visit: Payer: Self-pay | Admitting: Family

## 2022-11-22 DIAGNOSIS — F411 Generalized anxiety disorder: Secondary | ICD-10-CM

## 2022-11-23 MED ORDER — LISDEXAMFETAMINE DIMESYLATE 40 MG PO CAPS: 40.0000 mg | ORAL_CAPSULE | ORAL | 0 refills | Status: DC

## 2022-11-23 MED ORDER — LISDEXAMFETAMINE DIMESYLATE 40 MG PO CAPS: 40.0000 mg | ORAL_CAPSULE | Freq: Every day | ORAL | 0 refills | Status: DC

## 2022-11-23 NOTE — Addendum Note (Signed)
Addended byDulce Sellar on: 11/23/2022 12:00 PM   Modules accepted: Orders

## 2022-12-03 ENCOUNTER — Telehealth: Payer: Self-pay | Admitting: Family

## 2022-12-03 NOTE — Telephone Encounter (Signed)
Patient would like a call re VYVANSE- patient would like a generic version for it. Patient has not been able to start on meds due to shortage.

## 2022-12-04 ENCOUNTER — Other Ambulatory Visit: Payer: Self-pay | Admitting: Family

## 2022-12-04 DIAGNOSIS — F900 Attention-deficit hyperactivity disorder, predominantly inattentive type: Secondary | ICD-10-CM

## 2022-12-04 MED ORDER — LISDEXAMFETAMINE DIMESYLATE 40 MG PO CAPS: 40.0000 mg | ORAL_CAPSULE | Freq: Every day | ORAL | 0 refills | Status: DC

## 2022-12-04 NOTE — Telephone Encounter (Signed)
Patient found pharmacy that has medication on stock : [facility]  [facility] [phone] .  Fax: [phone].

## 2022-12-28 ENCOUNTER — Other Ambulatory Visit: Payer: Self-pay | Admitting: Family

## 2022-12-28 DIAGNOSIS — F900 Attention-deficit hyperactivity disorder, predominantly inattentive type: Secondary | ICD-10-CM

## 2022-12-28 MED ORDER — LISDEXAMFETAMINE DIMESYLATE 40 MG PO CAPS: 40.0000 mg | ORAL_CAPSULE | Freq: Every day | ORAL | 0 refills | Status: DC

## 2022-12-29 ENCOUNTER — Other Ambulatory Visit: Payer: Self-pay | Admitting: Family

## 2022-12-29 DIAGNOSIS — F411 Generalized anxiety disorder: Secondary | ICD-10-CM

## 2023-01-07 LAB — HM PAP SMEAR

## 2023-02-13 ENCOUNTER — Other Ambulatory Visit: Payer: Self-pay | Admitting: Family

## 2023-02-13 DIAGNOSIS — F900 Attention-deficit hyperactivity disorder, predominantly inattentive type: Secondary | ICD-10-CM

## 2023-02-13 NOTE — Telephone Encounter (Signed)
she is due for 3 month f/u - refill at that time

## 2023-02-25 ENCOUNTER — Encounter: Payer: Self-pay | Admitting: Family

## 2023-02-27 ENCOUNTER — Ambulatory Visit: Payer: Commercial Managed Care - PPO | Admitting: Family

## 2023-02-28 ENCOUNTER — Ambulatory Visit: Payer: Commercial Managed Care - PPO | Admitting: Family

## 2023-02-28 ENCOUNTER — Encounter: Payer: Self-pay | Admitting: Family

## 2023-02-28 VITALS — BP 117/66 | HR 64 | Temp 98.0°F | Ht 67.0 in | Wt 200.4 lb

## 2023-02-28 DIAGNOSIS — N951 Menopausal and female climacteric states: Secondary | ICD-10-CM | POA: Diagnosis not present

## 2023-02-28 DIAGNOSIS — F900 Attention-deficit hyperactivity disorder, predominantly inattentive type: Secondary | ICD-10-CM | POA: Diagnosis not present

## 2023-02-28 MED ORDER — AMPHETAMINE-DEXTROAMPHET ER 15 MG PO CP24: 15.0000 mg | ORAL_CAPSULE | ORAL | 0 refills | Status: DC

## 2023-02-28 NOTE — Assessment & Plan Note (Addendum)
Dx 3 mos ago last visit increased Vyvanse to 30 & then 40mg  over MyChart msg pt has had to be off the Vyvanse at different times d/t national shortage and this cuases her a lot of bad SE will switch over to Adderall 15 ER qd she will message if dose ok or needs increase f/u in 3 month

## 2023-02-28 NOTE — Progress Notes (Signed)
Patient ID: Dana Underwood, female    DOB: 1981-05-24, 42 y.o.   MRN: [id number]  Chief Complaint  Patient presents with   ADHD    HPI: ADHD:  having trouble with organizing, easily distracted, difficulty focusing. Reports having similar sx in college, but never tested for ADHD, she just pushed through and worked/studied much longer than friends. Reports seeing therapist who tested her for ADHD d/t her continued anxiety sx. Started Vyvanse 20mg  last visit, then increased to 40mg  and has been out of for last week and it worsens her anxiety when off. Denies any SE.  Irregular menses:  having low labido, heavy cycles and long, last one in Feb, having wt gain, cravings, sees GYN and suggested low dose Progesterone. pt has gained 8lbs since Dec. Has started taking a homeopathic mixture inc black cohosh which helps a little.    Assessment & Plan:  ADHD (attention deficit hyperactivity disorder), inattentive type Assessment & Plan: Dx 3 mos ago last visit increased Vyvanse to 30 & then 40mg  over MyChart msg pt has had to be off the Vyvanse at different times d/t national shortage and this cuases her a lot of bad SE will switch over to Adderall 15 ER qd she will message if dose ok or needs increase f/u in 3 month   Orders: -     Amphetamine-Dextroamphet ER; Take 1 capsule by mouth every morning.  Dispense: 30 capsule; Refill: 0 -     Amphetamine-Dextroamphet ER; Take 1 capsule by mouth every morning.  Dispense: 30 capsule; Refill: 0 -     Amphetamine-Dextroamphet ER; Take 1 capsule by mouth every morning.  Dispense: 30 capsule; Refill: 0  Perimenopausal symptoms- advised pt to f/u with GYN, discussed taking progesterone during cycle to lessen menses flow, ok to continue black cohosh mixture, decrease caffeine intake, dress in light layers.   Subjective:    Outpatient Medications Prior to Visit  Medication Sig Dispense Refill   albuterol (VENTOLIN HFA) 108 (90 Base) MCG/ACT inhaler  SMARTSIG:1 Puff(s) Via Inhaler Every 6 Hours PRN     Cyanocobalamin (VITAMIN B 12 PO) Take by mouth.     FLUoxetine (PROZAC) 10 MG capsule TAKE 1 CAPSULE(10 MG) BY MOUTH DAILY 30 capsule 2   fluticasone (FLONASE) 50 MCG/ACT nasal spray 1 spray each nostril twice a day for 2 weeks, then reduce to 1 spray each nostril daily. 16 g 1   levothyroxine (SYNTHROID) 88 MCG tablet TAKE 1 TABLET(88 MCG) BY MOUTH DAILY 90 tablet 1   LORazepam (ATIVAN) 1 MG tablet Take 0.5-1 tablets (0.5-1 mg total) by mouth daily as needed for anxiety. 30 tablet 1   Multiple Vitamin (MULTIVITAMIN) tablet Take 1 tablet by mouth daily.     Omega-3 Fatty Acids (FISH OIL PO) Take by mouth.     Probiotic Product (PROBIOTIC PO) Take by mouth.     tiZANidine (ZANAFLEX) 4 MG tablet Take 1 tablet (4 mg total) by mouth every 6 (six) hours as needed for muscle spasms. 30 tablet 0   valACYclovir (VALTREX) 1000 MG tablet Take 2,000 mg by mouth 2 (two) times daily.     lisdexamfetamine (VYVANSE) 40 MG capsule Take 1 capsule (40 mg total) by mouth every morning. 30 capsule 0   lisdexamfetamine (VYVANSE) 40 MG capsule Take 1 capsule (40 mg total) by mouth daily. 30 capsule 0   No facility-administered medications prior to visit.   Past Medical History:  Diagnosis Date   Allergy    Anxiety  Asthma    Depression    Endometriosis 2016   2019   Thyroid disease    No past surgical history on file. Allergies  Allergen Reactions   Penicillins Dermatitis, Hives, Itching, Rash and Swelling      Objective:    Physical Exam Vitals and nursing note reviewed.  Constitutional:      Appearance: Normal appearance.  Cardiovascular:     Rate and Rhythm: Normal rate and regular rhythm.  Pulmonary:     Effort: Pulmonary effort is normal.     Breath sounds: Normal breath sounds.  Musculoskeletal:        General: Normal range of motion.  Skin:    General: Skin is warm and dry.  Neurological:     Mental Status: She is alert.   Psychiatric:        Mood and Affect: Mood normal.        Behavior: Behavior normal.    BP 117/66 (BP Location: Left Arm, Patient Position: Sitting, Cuff Size: Large)   Pulse 64   Temp 98 F (36.7 C) (Temporal)   Ht 5\' 7"  (1.702 m)   Wt 200 lb 6 oz (90.9 kg)   LMP  (LMP Unknown)   SpO2 96%   BMI 31.38 kg/m  Wt Readings from Last 3 Encounters:  02/28/23 200 lb 6 oz (90.9 kg)  10/26/22 192 lb (87.1 kg)  10/03/22 191 lb 2 oz (86.7 kg)      Dulce Sellar, NP

## 2023-03-01 ENCOUNTER — Encounter: Payer: Self-pay | Admitting: Family

## 2023-03-01 DIAGNOSIS — F900 Attention-deficit hyperactivity disorder, predominantly inattentive type: Secondary | ICD-10-CM

## 2023-03-04 ENCOUNTER — Other Ambulatory Visit (HOSPITAL_COMMUNITY): Payer: Self-pay

## 2023-03-04 MED ORDER — AMPHETAMINE-DEXTROAMPHETAMINE 15 MG PO TABS: 15.0000 mg | ORAL_TABLET | Freq: Two times a day (BID) | ORAL | 0 refills | Status: DC

## 2023-03-04 NOTE — Telephone Encounter (Signed)
Let her know dose switched to 15mg  IR bid, can cut afternoon pill in half if too strong. l sent 3 months, but if they have XR in stock & prefers to get that next month, let me know, thx

## 2023-03-05 ENCOUNTER — Other Ambulatory Visit (HOSPITAL_COMMUNITY): Payer: Self-pay

## 2023-03-13 ENCOUNTER — Other Ambulatory Visit (HOSPITAL_COMMUNITY): Payer: Self-pay

## 2023-03-14 ENCOUNTER — Telehealth: Payer: Self-pay

## 2023-03-14 ENCOUNTER — Other Ambulatory Visit (HOSPITAL_COMMUNITY): Payer: Self-pay

## 2023-03-14 NOTE — Telephone Encounter (Signed)
thanks! please keep Ladeana informed.

## 2023-03-14 NOTE — Telephone Encounter (Signed)
Pharmacy Patient Advocate Encounter   Received notification that prior authorization for Amphetamine-Dextroamphetamine  tablets is required/requested.   PA submitted on 03/14/23 to (ins) [employer] via [employer] or (Medicaid) confirmation # [id number] Status is pending

## 2023-03-18 ENCOUNTER — Other Ambulatory Visit (HOSPITAL_COMMUNITY): Payer: Self-pay

## 2023-03-18 NOTE — Telephone Encounter (Signed)
Error in CMM. [employer], faxing PA form. Will complete and fax back

## 2023-03-21 ENCOUNTER — Other Ambulatory Visit (HOSPITAL_COMMUNITY): Payer: Self-pay

## 2023-03-21 NOTE — Telephone Encounter (Signed)
Any updates on approval or denial?

## 2023-03-21 NOTE — Telephone Encounter (Signed)
Patient Advocate Encounter  Prior Authorization for Amphetamine-Dextroamphetamine  tablets  has been approved with [employer].    Per [facility] test claim, copay for 30 days supply is $32.98  PA# [id number] Effective dates: 03/14/23 through 11/25/2038

## 2023-03-25 NOTE — Telephone Encounter (Signed)
Patient notified of medication approval

## 2023-03-30 ENCOUNTER — Other Ambulatory Visit: Payer: Self-pay | Admitting: Family

## 2023-03-30 DIAGNOSIS — F411 Generalized anxiety disorder: Secondary | ICD-10-CM

## 2023-05-06 ENCOUNTER — Other Ambulatory Visit: Payer: Self-pay | Admitting: Family

## 2023-05-06 ENCOUNTER — Encounter: Payer: Self-pay | Admitting: Family

## 2023-05-06 DIAGNOSIS — F411 Generalized anxiety disorder: Secondary | ICD-10-CM

## 2023-05-06 DIAGNOSIS — F900 Attention-deficit hyperactivity disorder, predominantly inattentive type: Secondary | ICD-10-CM

## 2023-05-06 NOTE — Telephone Encounter (Signed)
call pharmacy please - RX was sent for this month back in April - why are they requesting?

## 2023-05-08 MED ORDER — FLUOXETINE HCL 10 MG PO TABS: 15.0000 mg | ORAL_TABLET | Freq: Every day | ORAL | 1 refills | Status: DC

## 2023-05-17 LAB — HM MAMMOGRAPHY

## 2023-06-07 ENCOUNTER — Encounter: Payer: Self-pay | Admitting: Family

## 2023-06-07 ENCOUNTER — Ambulatory Visit (INDEPENDENT_AMBULATORY_CARE_PROVIDER_SITE_OTHER): Payer: Commercial Managed Care - PPO | Admitting: Family

## 2023-06-07 VITALS — BP 114/79 | HR 81 | Temp 98.2°F | Ht 67.0 in | Wt 192.8 lb

## 2023-06-07 DIAGNOSIS — Z0000 Encounter for general adult medical examination without abnormal findings: Secondary | ICD-10-CM

## 2023-06-07 DIAGNOSIS — E669 Obesity, unspecified: Secondary | ICD-10-CM | POA: Diagnosis not present

## 2023-06-07 DIAGNOSIS — F900 Attention-deficit hyperactivity disorder, predominantly inattentive type: Secondary | ICD-10-CM | POA: Diagnosis not present

## 2023-06-07 DIAGNOSIS — E039 Hypothyroidism, unspecified: Secondary | ICD-10-CM

## 2023-06-07 DIAGNOSIS — Z1159 Encounter for screening for other viral diseases: Secondary | ICD-10-CM

## 2023-06-07 DIAGNOSIS — Z6830 Body mass index (BMI) 30.0-30.9, adult: Secondary | ICD-10-CM

## 2023-06-07 MED ORDER — LISDEXAMFETAMINE DIMESYLATE 40 MG PO CAPS: 40.0000 mg | ORAL_CAPSULE | ORAL | 0 refills | Status: DC

## 2023-06-07 NOTE — Patient Instructions (Addendum)
It was very nice to see you today!   I will review your lab results via MyChart in a few days.  I have sent a refill of your Vyvanse. Let me know if this is working and I will try to send in 90 day supply.  Have a great weekend!     PLEASE NOTE:  If you had any lab tests please let us know if you have not heard back within a few days. You may see your results on MyChart before we have a chance to review them but we will give you a call once they are reviewed by [place]. If we ordered any referrals today, please let us know if you have not heard from their office within the next week.

## 2023-06-07 NOTE — Assessment & Plan Note (Signed)
chronic, Unstable reports Adderall XR and then IR bid did not work for her wants to get back on Vyvanse 40mg  every day sending 30d supply to be sure working ok, then will try & send 90d fill f/u in 3 month

## 2023-06-07 NOTE — Progress Notes (Signed)
Phone [phone]   Subjective:   Patient is a 42 y.o. female presenting for annual physical.    Chief Complaint  Patient presents with   ADHD    Discuss Adderall, does not think it is working    Annual Exam    fasting w Vickie Epley (Monday draw)   Hypothyroidism   HPI: Hypothyroidism: Patient presents today for followup of Hypothyroidism.  Patient reports positive compliance with daily medication. Levo & started on Cytomel 3 mos ago by ENDO d/t  varied sx she's been having, states it has not helped. Patient denies any of the following symptoms: fatigue, cold intolerance, constipation, weight gain or inability to lose weight, muscle weakness, mental slowing, dry hair and skin. Last TSH and free T4: Lab Results  Component Value Date   FREE T4 1.13 10/03/2022   FREE T4 1.02 01/18/2022   TSH 0.41 10/03/2022   TSH 1.10 01/18/2022  ADHD:  having trouble with organizing, easily distracted, difficulty focusing. Reports having similar sx in college, but never tested for ADHD, she just pushed through and worked/studied much longer than friends. Reports seeing therapist who tested her for ADHD d/t her continued anxiety sx. Hx of taking Vyvanse 40mg  which worked well for her, but had to switch to Adderall d/t med shortage. We tried XR and then IR bid, but she states neither worked as well as the Vyvanse, wants to get back on this.   See problem oriented charting- ROS- full  review of systems was completed and negative.   The following were reviewed and entered/updated in epic: Past Medical History:  Diagnosis Date   Allergy    Anxiety    Asthma    Depression    Endometriosis 2016   2019   Thyroid disease    Patient Active Problem List   Diagnosis Date Noted   Perimenopausal symptoms 02/28/2023   ADHD (attention deficit hyperactivity disorder), inattentive type 10/03/2022   Chronic back pain 01/22/2022   Acquired hypothyroidism 01/18/2022   Multiple thyroid nodules 01/18/2022    Generalized anxiety disorder 01/18/2022   No past surgical history on file.  Family History  Problem Relation Age of Onset   Cancer Mother    Depression Mother    Early death Mother    Anxiety disorder Father    Depression Father    Hypertension Father    Vision loss Father    Cancer Maternal Grandfather    Heart disease Maternal Grandmother    Hypertension Maternal Grandmother    Cancer Paternal Grandfather    Hypertension Paternal Grandmother    Stroke Paternal Grandmother    ADD / ADHD Brother    Anxiety disorder Brother    Depression Brother     Medications- reviewed and updated Current Outpatient Medications  Medication Sig Dispense Refill   albuterol (VENTOLIN HFA) 108 (90 Base) MCG/ACT inhaler SMARTSIG:1 Puff(s) Via Inhaler Every 6 Hours PRN     FLUoxetine (PROZAC) 10 MG tablet Take 1.5 tablets (15 mg total) by mouth daily. 135 tablet 1   fluticasone (FLONASE) 50 MCG/ACT nasal spray 1 spray each nostril twice a day for 2 weeks, then reduce to 1 spray each nostril daily. 16 g 1   levothyroxine (SYNTHROID) 50 MCG tablet Take 50 mcg by mouth daily before breakfast.     liothyronine (CYTOMEL) 5 MCG tablet Take by mouth.     lisdexamfetamine (VYVANSE) 40 MG capsule Take 1 capsule (40 mg total) by mouth every morning. 30 capsule 0   LORazepam (ATIVAN)  1 MG tablet Take 0.5-1 tablets (0.5-1 mg total) by mouth daily as needed for anxiety. 30 tablet 1   Multiple Vitamin (MULTIVITAMIN) tablet Take 1 tablet by mouth daily.     Probiotic Product (PROBIOTIC PO) Take by mouth.     valACYclovir (VALTREX) 1000 MG tablet Take 2,000 mg by mouth 2 (two) times daily.     No current facility-administered medications for this visit.    Allergies-reviewed and updated Allergies  Allergen Reactions   Penicillins Dermatitis, Hives, Itching, Rash and Swelling    Social History   Social History Narrative   Not on file   Objective  Objective:  BP 114/79   Pulse 81   Temp 98.2 F  (36.8 C) (Temporal)   Ht 5\' 7"  (1.702 m)   Wt 192 lb 12.8 oz (87.5 kg)   LMP 05/17/2023 (Exact Date)   SpO2 98%   BMI 30.20 kg/m  Physical Exam Vitals and nursing note reviewed.  Constitutional:      Appearance: Normal appearance.  HENT:     Head: Normocephalic.     Right Ear: Tympanic membrane normal.     Left Ear: Tympanic membrane normal.     Nose: Nose normal.     Mouth/Throat:     Mouth: Mucous membranes are moist.  Eyes:     Pupils: Pupils are equal, round, and reactive to light.  Cardiovascular:     Rate and Rhythm: Normal rate and regular rhythm.  Pulmonary:     Effort: Pulmonary effort is normal.     Breath sounds: Normal breath sounds.  Musculoskeletal:        General: Normal range of motion.     Cervical back: Normal range of motion.  Lymphadenopathy:     Cervical: No cervical adenopathy.  Skin:    General: Skin is warm and dry.  Neurological:     Mental Status: She is alert.  Psychiatric:        Mood and Affect: Mood normal.        Behavior: Behavior normal.       Assessment and Plan   Health Maintenance counseling: 1. Anticipatory guidance: Patient counseled regarding regular dental exams q6 months, eye exams,  avoiding smoking and second hand smoke, limiting alcohol to 1 beverage per day, no illicit drugs.   2. Risk factor reduction:  Advised patient of need for regular exercise and diet rich with fruits and vegetables to reduce risk of heart attack and stroke.   Wt Readings from Last 3 Encounters:  06/07/23 192 lb 12.8 oz (87.5 kg)  02/28/23 200 lb 6 oz (90.9 kg)  10/26/22 192 lb (87.1 kg)   3. Immunizations/screenings/ancillary studies Immunization History  Administered Date(s) Administered   Tdap 03/04/2015   Health Maintenance Due  Topic Date Due   HIV Screening  Never done   MAMMOGRAM  Never done   Hepatitis C Screening  Never done    4. Cervical cancer screening- done - normal 5. Breast cancer screening-  mammogram done this year 6.  Colon cancer screening - N/A 7. Skin cancer screening- advised regular sunscreen use. Denies worrisome, changing, or new skin lesions.  8. Birth control/STD check- none, not opposed to getting pregnant 9. Osteoporosis screening- N/A 10. Alcohol screening: rarely 11. Smoking associated screening (lung cancer screening, AAA screen 65-75, UA)- non smoker  Annual physical exam pt prefers to be fasting for labs & forgot to do this today. Wants to come back on Monday, fasting.  -  Comprehensive metabolic panel; Future -     CBC with Differential/Platelet; Future -     HIV Antibody (routine testing w rflx); Future  Acquired hypothyroidism Assessment & Plan: chronic taking Levo every day having increased foggy headedness, difficulty concentrating, thought r/t thyroid vs peri-menopausal vs ADHD not at goal seen by ENDO who reduced her Levo dose to 50 & started Cytomel 3mos ago no difference in sx rechecking labs today & adjusting ADHD med f/u prn  Orders: -     T3; Future -     T3, free; Future -     T4, free; Future -     TSH; Future  ADHD (attention deficit hyperactivity disorder), inattentive type Assessment & Plan: chronic, Unstable reports Adderall XR and then IR bid did not work for her wants to get back on Vyvanse 40mg  every day sending 30d supply to be sure working ok, then will try & send 90d fill f/u in 3 month   Orders: -     Lisdexamfetamine Dimesylate; Take 1 capsule (40 mg total) by mouth every morning.  Dispense: 30 capsule; Refill: 0  Need for hepatitis C screening test -     Hepatitis C antibody; Future  Obesity (BMI 30-39.9) -     Lipid panel; Future   Recommended follow up: No follow-ups on file. Future Appointments  Date Time Provider Department Center  06/10/2023  9:30 AM [facility]   Lab/Order associations:  fasting   Dulce Sellar, NP

## 2023-06-07 NOTE — Assessment & Plan Note (Signed)
chronic taking Levo every day having increased foggy headedness, difficulty concentrating, thought r/t thyroid vs peri-menopausal vs ADHD not at goal seen by ENDO who reduced her Levo dose to 50 & started Cytomel 3mos ago no difference in sx rechecking labs today & adjusting ADHD med f/u prn

## 2023-06-10 ENCOUNTER — Other Ambulatory Visit: Payer: Commercial Managed Care - PPO

## 2023-06-12 ENCOUNTER — Other Ambulatory Visit (HOSPITAL_COMMUNITY): Payer: Self-pay

## 2023-06-12 NOTE — Telephone Encounter (Signed)
so let Charles know - she can call around again for the generic - try [facility], [facility] or [facility] pharmacies first, and let me know.

## 2023-06-13 ENCOUNTER — Telehealth: Payer: Self-pay

## 2023-06-13 DIAGNOSIS — F900 Attention-deficit hyperactivity disorder, predominantly inattentive type: Secondary | ICD-10-CM

## 2023-06-13 MED ORDER — AMPHETAMINE-DEXTROAMPHETAMINE 20 MG PO TABS
20.0000 mg | ORAL_TABLET | Freq: Every day | ORAL | 0 refills | Status: DC
Start: 2023-06-13 — End: 2023-09-05

## 2023-06-13 MED ORDER — AMPHETAMINE-DEXTROAMPHETAMINE 20 MG PO TABS
20.0000 mg | ORAL_TABLET | Freq: Every day | ORAL | 0 refills | Status: DC
Start: 2023-08-12 — End: 2023-09-24

## 2023-06-13 MED ORDER — AMPHETAMINE-DEXTROAMPHETAMINE 20 MG PO TABS
20.0000 mg | ORAL_TABLET | Freq: Every day | ORAL | 0 refills | Status: DC
Start: 2023-07-13 — End: 2023-09-05

## 2023-06-13 NOTE — Telephone Encounter (Signed)
Pt called in regards to not being able to find lisdexamfetamine (VYVANSE) 40 MG capsule. Pt would like to increase amphetamine-dextroamphetamine (ADDERALL XR) 15 MG 24 hr capsule to 20 MG twice daily. Pt does not want XR. Please send RX to [facility] IN [place], [place].   Please advise

## 2023-06-13 NOTE — Telephone Encounter (Signed)
RX sent to [facility].

## 2023-06-13 NOTE — Addendum Note (Signed)
Addended byDulce Sellar on: 06/13/2023 04:54 PM   Modules accepted: Orders

## 2023-07-12 ENCOUNTER — Telehealth: Payer: Commercial Managed Care - PPO | Admitting: Nurse Practitioner

## 2023-07-12 DIAGNOSIS — J45909 Unspecified asthma, uncomplicated: Secondary | ICD-10-CM | POA: Diagnosis not present

## 2023-07-12 MED ORDER — CETIRIZINE HCL 10 MG PO TABS
10.0000 mg | ORAL_TABLET | Freq: Every day | ORAL | 2 refills | Status: AC
Start: 2023-07-12 — End: 2024-06-19

## 2023-07-12 MED ORDER — ALBUTEROL SULFATE HFA 108 (90 BASE) MCG/ACT IN AERS: 2.0000 | INHALATION_SPRAY | Freq: Four times a day (QID) | RESPIRATORY_TRACT | 0 refills | Status: DC | PRN

## 2023-07-12 MED ORDER — FLUTICASONE PROPIONATE 50 MCG/ACT NA SUSP: 2.0000 | Freq: Every day | NASAL | 6 refills | Status: DC

## 2023-07-12 NOTE — Progress Notes (Signed)
E-Visit for Cough  We are sorry that you are not feeling well.  Here is how we plan to help!  Based on your presentation I believe you most likely have A cough due to allergies.  I recommend that you start the an over-the counter-allergy medication such as Claritin 10 mg or Zyrtec 10 mg daily.  Also continue your flonase daily, a refill has been sent in to assure you have enough.    In addition you may use A non-prescription cough medication called Mucinex DM: take 2 tablets every 12 hours. This is an over the counter medication.   It is also important that you are using your Albuterol inhaler every 4-6 hours as needed. We will send in a refill of this to assure you have enough.   Providers prescribe antibiotics to treat infections caused by bacteria. Antibiotics are very powerful in treating bacterial infections when they are used properly. To maintain their effectiveness, they should be used only when necessary. Overuse of antibiotics has resulted in the development of superbugs that are resistant to treatment!    After careful review of your answers, I would not recommend an antibiotic for your condition.  Antibiotics are not effective against viruses and therefore should not be used to treat them. Common examples of infections caused by viruses include colds and flu   From your responses in the eVisit questionnaire you describe inflammation in the upper respiratory tract which is causing a significant cough.  This is commonly called Bronchitis and has four common causes:   Allergies Viral Infections Acid Reflux Bacterial Infection Allergies, viruses and acid reflux are treated by controlling symptoms or eliminating the cause. An example might be a cough caused by taking certain blood pressure medications. You stop the cough by changing the medication. Another example might be a cough caused by acid reflux. Controlling the reflux helps control the cough.  USE OF BRONCHODILATOR ("RESCUE")  INHALERS: There is a risk from using your bronchodilator too frequently.  The risk is that over-reliance on a medication which only relaxes the muscles surrounding the breathing tubes can reduce the effectiveness of medications prescribed to reduce swelling and congestion of the tubes themselves.  Although you feel brief relief from the bronchodilator inhaler, your asthma may actually be worsening with the tubes becoming more swollen and filled with mucus.  This can delay other crucial treatments, such as oral steroid medications. If you need to use a bronchodilator inhaler daily, several times per day, you should discuss this with your provider.  There are probably better treatments that could be used to keep your asthma under control.     HOME CARE Only take medications as instructed by your medical team. Complete the entire course of an antibiotic. Drink plenty of fluids and get plenty of rest. Avoid close contacts especially the very young and the elderly Cover your mouth if you cough or cough into your sleeve. Always remember to wash your hands A steam or ultrasonic humidifier can help congestion.   GET HELP RIGHT AWAY IF: You develop worsening fever. You become short of breath You cough up blood. Your symptoms persist after you have completed your treatment plan MAKE SURE YOU  Understand these instructions. Will watch your condition. Will get help right away if you are not doing well or get worse.    Thank you for choosing an e-visit.  Your e-visit answers were reviewed by a board certified advanced clinical practitioner to complete your personal care plan. Depending upon  the condition, your plan could have included both over the counter or prescription medications.  Please review your pharmacy choice. Make sure the pharmacy is open so you can pick up prescription now. If there is a problem, you may contact your provider through [employer] and have the prescription routed to  another pharmacy.  Your safety is important to [place]. If you have drug allergies check your prescription carefully.   For the next 24 hours you can use MyChart to ask questions about today's visit, request a non-urgent call back, or ask for a work or school excuse. You will get an email in the next two days asking about your experience. I hope that your e-visit has been valuable and will speed your recovery.   Meds ordered this encounter  Medications   cetirizine (ZYRTEC ALLERGY) 10 MG tablet    Sig: Take 1 tablet (10 mg total) by mouth at bedtime.    Dispense:  30 tablet    Refill:  2   fluticasone (FLONASE) 50 MCG/ACT nasal spray    Sig: Place 2 sprays into both nostrils daily.    Dispense:  16 g    Refill:  6   albuterol (VENTOLIN HFA) 108 (90 Base) MCG/ACT inhaler    Sig: Inhale 2 puffs into the lungs every 6 (six) hours as needed for wheezing or shortness of breath.    Dispense:  8 g    Refill:  0     I spent approximately 5 minutes reviewing the patient's history, current symptoms and coordinating their care today.

## 2023-08-08 ENCOUNTER — Encounter: Payer: Self-pay | Admitting: Family

## 2023-08-08 DIAGNOSIS — Z0000 Encounter for general adult medical examination without abnormal findings: Secondary | ICD-10-CM

## 2023-08-08 DIAGNOSIS — J45909 Unspecified asthma, uncomplicated: Secondary | ICD-10-CM

## 2023-08-08 NOTE — Telephone Encounter (Signed)
yes please, and let patient know, and that she needs to schedule appt for the labs, thx

## 2023-08-14 ENCOUNTER — Other Ambulatory Visit: Payer: Commercial Managed Care - PPO

## 2023-08-22 ENCOUNTER — Other Ambulatory Visit (INDEPENDENT_AMBULATORY_CARE_PROVIDER_SITE_OTHER): Payer: Commercial Managed Care - PPO

## 2023-08-22 DIAGNOSIS — Z0000 Encounter for general adult medical examination without abnormal findings: Secondary | ICD-10-CM | POA: Diagnosis not present

## 2023-08-22 DIAGNOSIS — E669 Obesity, unspecified: Secondary | ICD-10-CM

## 2023-08-22 DIAGNOSIS — E039 Hypothyroidism, unspecified: Secondary | ICD-10-CM | POA: Diagnosis not present

## 2023-08-22 DIAGNOSIS — Z1159 Encounter for screening for other viral diseases: Secondary | ICD-10-CM

## 2023-08-22 LAB — COMPREHENSIVE METABOLIC PANEL
ALT: 9 U/L (ref 0–35)
AST: 11 U/L (ref 0–37)
Albumin: 4.3 g/dL (ref 3.5–5.2)
Alkaline Phosphatase: 49 U/L (ref 39–117)
BUN: 13 mg/dL (ref 6–23)
CO2: 27 mEq/L (ref 19–32)
Calcium: 9.3 mg/dL (ref 8.4–10.5)
Chloride: 106 mEq/L (ref 96–112)
Creatinine, Ser: 0.76 mg/dL (ref 0.40–1.20)
GFR: 96.18 mL/min (ref >60.00)
Glucose, Bld: 87 mg/dL (ref 70–99)
Potassium: 3.8 mEq/L (ref 3.5–5.1)
Sodium: 141 mEq/L (ref 135–145)
Total Bilirubin: 0.6 mg/dL (ref 0.2–1.2)
Total Protein: 6.6 g/dL (ref 6.0–8.3)

## 2023-08-22 LAB — CBC WITH DIFFERENTIAL/PLATELET
Basophils Absolute: 0 10*3/uL (ref 0.0–0.1)
Basophils Relative: 0.6 % (ref 0.0–3.0)
Eosinophils Absolute: 0.4 10*3/uL (ref 0.0–0.7)
Eosinophils Relative: 6.8 % — ABNORMAL HIGH (ref 0.0–5.0)
HCT: 37.5 % (ref 36.0–46.0)
Hemoglobin: 12.4 g/dL (ref 12.0–15.0)
Lymphocytes Relative: 30.3 % (ref 12.0–46.0)
Lymphs Abs: 1.7 10*3/uL (ref 0.7–4.0)
MCHC: 33 g/dL (ref 30.0–36.0)
MCV: 88.3 fl (ref 78.0–100.0)
Monocytes Absolute: 0.4 10*3/uL (ref 0.1–1.0)
Monocytes Relative: 6.3 % (ref 3.0–12.0)
Neutro Abs: 3.1 10*3/uL (ref 1.4–7.7)
Neutrophils Relative %: 56 % (ref 43.0–77.0)
Platelets: 309 10*3/uL (ref 150.0–400.0)
RBC: 4.24 Mil/uL (ref 3.87–5.11)
RDW: 13.7 % (ref 11.5–15.5)
WBC: 5.6 10*3/uL (ref 4.0–10.5)

## 2023-08-22 LAB — LIPID PANEL
Cholesterol: 171 mg/dL (ref 0–200)
HDL: 73.8 mg/dL (ref >39.00)
LDL Cholesterol: 84 mg/dL (ref 0–99)
NonHDL: 96.74
Total CHOL/HDL Ratio: 2
Triglycerides: 62 mg/dL (ref 0.0–149.0)
VLDL: 12.4 mg/dL (ref 0.0–40.0)

## 2023-08-22 LAB — TESTOSTERONE: Testosterone: 39.88 ng/dL (ref 15.00–40.00)

## 2023-08-22 LAB — T4, FREE: Free T4: 0.93 ng/dL (ref 0.60–1.60)

## 2023-08-22 LAB — TSH: TSH: 0.4 u[IU]/mL (ref 0.35–5.50)

## 2023-08-22 LAB — T3, FREE: T3, Free: 3.8 pg/mL (ref 2.3–4.2)

## 2023-08-23 LAB — HEPATITIS C ANTIBODY: Hep C Virus Ab: NONREACTIVE

## 2023-08-23 LAB — T3: T3, Total: 156 ng/dL (ref 71–180)

## 2023-08-23 LAB — HIV ANTIBODY (ROUTINE TESTING W REFLEX): HIV Screen 4th Generation wRfx: NONREACTIVE

## 2023-08-26 ENCOUNTER — Encounter: Payer: Self-pay | Admitting: Family

## 2023-09-04 NOTE — Progress Notes (Unsigned)
New Patient Note  RE: Dana Underwood MRN: [id number] DOB: 06/10/1981 Date of Office Visit: 09/05/2023  Consult requested by: Dulce Sellar, NP Primary care provider: Dulce Sellar, NP  Chief Complaint: No chief complaint on file.  History of Present Illness: I had the pleasure of seeing Dana Underwood for initial evaluation at the [facility] on 09/04/2023. She is a 42 y.o. female, who is referred here by Dulce Sellar, NP for the evaluation of asthma and environmental allergies.  Discussed the use of AI scribe software for clinical note transcription with the patient, who gave verbal consent to proceed.  History of Present Illness             She reports symptoms of *** chest tightness, shortness of breath, coughing, wheezing, nocturnal awakenings for *** years. Current medications include *** which help. She reports *** using aerochamber with inhalers. She tried the following inhalers: ***. Main triggers are ***allergies, infections, weather changes, smoke, exercise, pet exposure. In the last month, frequency of symptoms: ***x/week. Frequency of nocturnal symptoms: ***x/month. Frequency of SABA use: ***x/week. Interference with physical activity: ***. Sleep is ***disturbed. In the last 12 months, emergency room visits/urgent care visits/doctor office visits or hospitalizations due to respiratory issues: ***. In the last 12 months, oral steroids courses: ***. Lifetime history of hospitalization for respiratory issues: ***. Prior intubations: ***. Asthma was diagnosed at age *** by ***. History of pneumonia: ***. She was evaluated by allergist ***pulmonologist in the past. Smoking exposure: ***. Up to date with flu vaccine: ***. Up to date with pneumonia vaccine: ***. Up to date with COVID-19 vaccine: ***. Prior Covid-19 infection: ***. History of reflux: ***.   She reports symptoms of ***. Symptoms have been going on for *** years. The symptoms are  present *** all year around with worsening in ***. Other triggers include exposure to ***. Anosmia: ***. Headache: ***. She has used *** with ***fair improvement in symptoms. Sinus infections: ***. Previous work up includes: ***. Previous ENT evaluation: ***. Previous sinus imaging: ***. History of nasal polyps: ***. Last eye exam: ***. History of reflux: ***.  Assessment and Plan: Dana Underwood is a 42 y.o. female with: ***  Assessment and Plan               No follow-ups on file.  No orders of the defined types were placed in this encounter.  Lab Orders  No laboratory test(s) ordered today    Other allergy screening: Asthma: {Blank single:19197::"yes","no"} Rhino conjunctivitis: {Blank single:19197::"yes","no"} Food allergy: {Blank single:19197::"yes","no"} Medication allergy: {Blank single:19197::"yes","no"} Hymenoptera allergy: {Blank single:19197::"yes","no"} Urticaria: {Blank single:19197::"yes","no"} Eczema:{Blank single:19197::"yes","no"} History of recurrent infections suggestive of immunodeficency: {Blank single:19197::"yes","no"}  Diagnostics: Spirometry:  Tracings reviewed. Her effort: {Blank single:19197::"Good reproducible efforts.","It was hard to get consistent efforts and there is a question as to whether this reflects a maximal maneuver.","Poor effort, data can not be interpreted."} FVC: ***L FEV1: ***L, ***% predicted FEV1/FVC ratio: ***% Interpretation: {Blank single:19197::"Spirometry consistent with mild obstructive disease","Spirometry consistent with moderate obstructive disease","Spirometry consistent with severe obstructive disease","Spirometry consistent with possible restrictive disease","Spirometry consistent with mixed obstructive and restrictive disease","Spirometry uninterpretable due to technique","Spirometry consistent with normal pattern","No overt abnormalities noted given today's efforts"}.  Please see scanned spirometry results for  details.  Skin Testing: {Blank single:19197::"Select foods","Environmental allergy panel","Environmental allergy panel and select foods","Food allergy panel","None","Deferred due to recent antihistamines use"}. *** Results discussed with patient/family.   Past Medical History: Patient Active Problem List   Diagnosis Date Noted  .  Perimenopausal symptoms 02/28/2023  . ADHD (attention deficit hyperactivity disorder), inattentive type 10/03/2022  . Chronic back pain 01/22/2022  . Acquired hypothyroidism 01/18/2022  . Multiple thyroid nodules 01/18/2022  . Generalized anxiety disorder 01/18/2022   Past Medical History:  Diagnosis Date  . Allergy   . Anxiety   . Asthma   . Depression   . Endometriosis 2016   2019  . Thyroid disease    Past Surgical History: No past surgical history on file. Medication List:  Current Outpatient Medications  Medication Sig Dispense Refill  . albuterol (VENTOLIN HFA) 108 (90 Base) MCG/ACT inhaler SMARTSIG:1 Puff(s) Via Inhaler Every 6 Hours PRN    . albuterol (VENTOLIN HFA) 108 (90 Base) MCG/ACT inhaler Inhale 2 puffs into the lungs every 6 (six) hours as needed for wheezing or shortness of breath. 8 g 0  . amphetamine-dextroamphetamine (ADDERALL) 20 MG tablet Take 1 tablet (20 mg total) by mouth daily before breakfast. 30 tablet 0  . amphetamine-dextroamphetamine (ADDERALL) 20 MG tablet Take 1 tablet (20 mg total) by mouth daily before breakfast. 30 tablet 0  . amphetamine-dextroamphetamine (ADDERALL) 20 MG tablet Take 1 tablet (20 mg total) by mouth daily before breakfast. 30 tablet 0  . cetirizine (ZYRTEC ALLERGY) 10 MG tablet Take 1 tablet (10 mg total) by mouth at bedtime. 30 tablet 2  . FLUoxetine (PROZAC) 10 MG tablet Take 1.5 tablets (15 mg total) by mouth daily. 135 tablet 1  . fluticasone (FLONASE) 50 MCG/ACT nasal spray 1 spray each nostril twice a day for 2 weeks, then reduce to 1 spray each nostril daily. 16 g 1  . fluticasone (FLONASE)  50 MCG/ACT nasal spray Place 2 sprays into both nostrils daily. 16 g 6  . levothyroxine (SYNTHROID) 50 MCG tablet Take 50 mcg by mouth daily before breakfast.    . liothyronine (CYTOMEL) 5 MCG tablet Take by mouth.    . lisdexamfetamine (VYVANSE) 40 MG capsule Take 1 capsule (40 mg total) by mouth every morning. 30 capsule 0  . LORazepam (ATIVAN) 1 MG tablet Take 0.5-1 tablets (0.5-1 mg total) by mouth daily as needed for anxiety. 30 tablet 1  . Multiple Vitamin (MULTIVITAMIN) tablet Take 1 tablet by mouth daily.    . Probiotic Product (PROBIOTIC PO) Take by mouth.    . valACYclovir (VALTREX) 1000 MG tablet Take 2,000 mg by mouth 2 (two) times daily.     No current facility-administered medications for this visit.   Allergies: Allergies  Allergen Reactions  . Penicillins Dermatitis, Hives, Itching, Rash and Swelling   Social History: Social History   Socioeconomic History  . Marital status: Married    Spouse name: Not on file  . Number of children: Not on file  . Years of education: Not on file  . Highest education level: Not on file  Occupational History  . Not on file  Tobacco Use  . Smoking status: Former    Current packs/day: 0.00    Types: Cigarettes, E-cigarettes    Quit date: 07/27/2014    Years since quitting: 9.1  . Smokeless tobacco: Never  Substance and Sexual Activity  . Alcohol use: Yes    Comment: Occasionally  . Drug use: Not Currently    Types: Marijuana    Comment: Have used in past. Used CBD edibles over 6mos ago  . Sexual activity: Yes    Birth control/protection: Other-see comments    Comment: Cycle trackimg  Other Topics Concern  . Not on file  Social History Narrative  .  Not on file   Social Determinants of Health   Financial Resource Strain: Low Risk  (03/11/2023)   Received from [facility], [facility]   Overall Financial Resource Strain (CARDIA)   . Difficulty of Paying Living Expenses: Not hard at all  Food Insecurity: No Food  Insecurity (03/11/2023)   Received from [facility], [facility]   Hunger Vital Sign   . Worried About Programme researcher, broadcasting/film/video in the Last Year: Never true   . Ran Out of Food in the Last Year: Never true  Transportation Needs: No Transportation Needs (03/11/2023)   Received from [facility], [facility]   [facility] - Transportation   . Lack of Transportation (Medical): No   . Lack of Transportation (Non-Medical): No  Physical Activity: Insufficiently Active (11/15/2022)   Received from [facility], [facility]   Exercise Vital Sign   . Days of Exercise per Week: 4 days   . Minutes of Exercise per Session: 20 min  Stress: Stress Concern Present (11/15/2022)   Received from [facility], [facility] - Occupational Stress Questionnaire   . Feeling of Stress : To some extent  Social Connections: Moderately Integrated (11/15/2022)   Received from [facility], [facility]   Social Network   . How would you rate your social network (family, work, friends)?: Adequate participation with social networks   Lives in a ***. Smoking: *** Occupation: ***  Environmental HistorySurveyor, minerals in the house: Copywriter, advertising in the family room: {Blank single:19197::"yes","no"} Carpet in the bedroom: {Blank single:19197::"yes","no"} Heating: {Blank single:19197::"electric","gas","heat pump"} Cooling: {Blank single:19197::"central","window","heat pump"} Pet: {Blank single:19197::"yes ***","no"}  Family History: Family History  Problem Relation Age of Onset  . Cancer Mother   . Depression Mother   . Early death Mother   . Anxiety disorder Father   . Depression Father   . Hypertension Father   . Vision loss Father   . Cancer Maternal Grandfather   . Heart disease Maternal Grandmother   . Hypertension Maternal Grandmother   . Cancer Paternal Grandfather   . Hypertension Paternal Grandmother   . Stroke  Paternal Grandmother   . ADD / ADHD Brother   . Anxiety disorder Brother   . Depression Brother    Problem                               Relation Asthma                                   *** Eczema                                *** Food allergy                          *** Allergic rhino conjunctivitis     ***  Review of Systems  Constitutional:  Negative for appetite change, chills, fever and unexpected weight change.  HENT:  Negative for congestion and rhinorrhea.   Eyes:  Negative for itching.  Respiratory:  Negative for cough, chest tightness, shortness of breath and wheezing.   Cardiovascular:  Negative for chest pain.  Gastrointestinal:  Negative for abdominal pain.  Genitourinary:  Negative for difficulty urinating.  Skin:  Negative  for rash.  Neurological:  Negative for headaches.   Objective: There were no vitals taken for this visit. There is no height or weight on file to calculate BMI. Physical Exam Vitals and nursing note reviewed.  Constitutional:      Appearance: Normal appearance. She is well-developed.  HENT:     Head: Normocephalic and atraumatic.     Right Ear: Tympanic membrane and external ear normal.     Left Ear: Tympanic membrane and external ear normal.     Nose: Nose normal.     Mouth/Throat:     Mouth: Mucous membranes are moist.     Pharynx: Oropharynx is clear.  Eyes:     Conjunctiva/sclera: Conjunctivae normal.  Cardiovascular:     Rate and Rhythm: Normal rate and regular rhythm.     Heart sounds: Normal heart sounds. No murmur heard.    No friction rub. No gallop.  Pulmonary:     Effort: Pulmonary effort is normal.     Breath sounds: Normal breath sounds. No wheezing, rhonchi or rales.  Musculoskeletal:     Cervical back: Neck supple.  Skin:    General: Skin is warm.     Findings: No rash.  Neurological:     Mental Status: She is alert and oriented to person, place, and time.  Psychiatric:        Behavior: Behavior normal.  The  plan was reviewed with the patient/family, and all questions/concerned were addressed.  It was my pleasure to see Dana Underwood today and participate in her care. Please feel free to contact me with any questions or concerns.  Sincerely,  Wyline Mood, DO Allergy & Immunology  [facility] office: [phone] [facility] office: [phone]

## 2023-09-05 ENCOUNTER — Encounter: Payer: Self-pay | Admitting: Allergy

## 2023-09-05 ENCOUNTER — Ambulatory Visit: Payer: Commercial Managed Care - PPO | Admitting: Allergy

## 2023-09-05 VITALS — BP 122/72 | HR 78 | Temp 98.1°F | Resp 18 | Ht 67.0 in | Wt 196.0 lb

## 2023-09-05 DIAGNOSIS — J4520 Mild intermittent asthma, uncomplicated: Secondary | ICD-10-CM | POA: Diagnosis not present

## 2023-09-05 DIAGNOSIS — H1013 Acute atopic conjunctivitis, bilateral: Secondary | ICD-10-CM

## 2023-09-05 DIAGNOSIS — Z880 Allergy status to penicillin: Secondary | ICD-10-CM

## 2023-09-05 DIAGNOSIS — J3089 Other allergic rhinitis: Secondary | ICD-10-CM | POA: Diagnosis not present

## 2023-09-05 MED ORDER — AIRSUPRA 90-80 MCG/ACT IN AERO: 2.0000 | INHALATION_SPRAY | RESPIRATORY_TRACT | 2 refills | Status: DC | PRN

## 2023-09-05 MED ORDER — RYALTRIS 665-25 MCG/ACT NA SUSP
1.0000 | Freq: Two times a day (BID) | NASAL | 5 refills | Status: AC
Start: 2023-09-05 — End: ?

## 2023-09-05 MED ORDER — LEVOCETIRIZINE DIHYDROCHLORIDE 5 MG PO TABS: 5.0000 mg | ORAL_TABLET | Freq: Every evening | ORAL | 5 refills | Status: AC

## 2023-09-05 MED ORDER — OLOPATADINE HCL 0.2 % OP SOLN: 1.0000 [drp] | Freq: Every day | OPHTHALMIC | 5 refills | Status: DC | PRN

## 2023-09-05 NOTE — Patient Instructions (Addendum)
Environmental allergies Return for allergy skin testing. No antihistamines and nasal spray for 3 days before. Will make additional recommendations based on results.  Use over the counter antihistamines such as Zyrtec (cetirizine), Claritin (loratadine), Allegra (fexofenadine), or Xyzal (levocetirizine) daily as needed. May take twice a day during allergy flares. May switch antihistamines every few months. Start Ryaltris (olopatadine + mometasone nasal spray combination) 1-2 sprays per nostril twice a day. Sample given. This replaces your other nasal sprays. If this works well for you, then have [facility] ship the medication to your home - prescription already sent in.  Nasal saline spray (i.e., Simply Saline) or nasal saline lavage (i.e., NeilMed) is recommended as needed and prior to medicated nasal sprays. Use olopatadine eye drops 0.2% once a day as needed for itchy/watery eyes.  Asthma May use Airsupra rescue inhaler 2 puffs every 4 to 6 hours as needed for shortness of breath, chest tightness, coughing, and wheezing. Do not use more than 12 puffs in 24 hours.  May use Airsupra rescue inhaler 2 puffs 5 to 15 minutes prior to strenuous physical activities. Rinse mouth after each use.  Coupon given. Sample given. Monitor frequency of use - if you need to use it more than twice per week on a consistent basis let us know.   Return for Skin testing. Or sooner if needed.

## 2023-09-23 NOTE — Progress Notes (Unsigned)
Skin testing note  RE: Dana Underwood MRN: [id number] DOB: 07-06-1981 Date of Office Visit: 09/24/2023  Referring provider: Dulce Sellar, NP Primary care provider: Dulce Sellar, NP  Chief Complaint: No chief complaint on file.  History of Present Illness: I had the pleasure of seeing Dana Underwood for a skin testing visit at the [facility] on 09/24/2023. She is a 42 y.o. female, who is being followed for allergic rhinoconjunctivitis, asthma, penicillin allergy. Her previous allergy office visit was on 09/05/2023 with Dr. Selena Batten. Today is a skin testing visit.   Discussed the use of AI scribe software for clinical note transcription with the patient, who gave verbal consent to proceed.  She had previously been on allergy shots for a brief period but discontinued.  The patient has been using Ryaltris nasal spray but is unsure of its effectiveness. She also reports using olopatadine eye drops (0.2%) for her allergies but finds them ineffective. She has previously used a stronger version (0.7%) of the eye drops, which she found helpful.  In addition, the patient has a history of asthma and has used the [place] inhaler for chest congestion and coughing, which helped. She has previously been on Singulair but does not recall a significant difference in her symptoms while on this medication.   Assessment and Plan: Dana Underwood is a 42 y.o. female with: Other allergic rhinitis Seasonal allergic rhinitis due to pollen Allergic rhinitis due to dust mite Allergic rhinitis due to mold Allergic rhinitis due to animal dander Allergy to cockroaches Allergic conjunctivitis of both eyes Past history - Chronic symptoms of sneezing, runny nose, itchy eyes, and nasal congestion. Previous allergy testing showed multiple positives. Currently on Zyrtec and Flonase with partial relief. History of allergy shots with limited duration and benefit. Today's skin testing positive to  grass, ragweed, weed, trees, mold, dust mites, cat, dog, cockroach. Borderline positive to feathers and horse.  Start environmental control measures as below. Use over the counter antihistamines such as Zyrtec (cetirizine), Claritin (loratadine), Allegra (fexofenadine), or Xyzal (levocetirizine) daily as needed. May take twice a day during allergy flares. May switch antihistamines every few months. Consider allergy injections for long term control if above medications do not help the symptoms - handout given. 2 shots.  Continue Ryaltris (olopatadine + mometasone nasal spray combination) 1-2 sprays per nostril twice a day.  Nasal saline spray (i.e., Simply Saline) or nasal saline lavage (i.e., NeilMed) is recommended as needed and prior to medicated nasal sprays. Use olopatadine 0.7% 1 drop in each eye daily as needed for itchy/watery eyes.  If not covered, you may have to get it OTC.  Mild intermittent asthma without complication Past history - Reports of chest congestion, cough, and occasional wheezing. Infrequent use of Albuterol rescue inhaler.  2024 spirometry showed normal pattern with no improvement in FEV1 post bronchodilator treatment. Clinically feeling unchanged.  May use Airsupra rescue inhaler 2 puffs every 4 to 6 hours as needed for shortness of breath, chest tightness, coughing, and wheezing. Do not use more than 12 puffs in 24 hours.  May use Airsupra rescue inhaler 2 puffs 5 to 15 minutes prior to strenuous physical activities. Rinse mouth after each use.  Monitor frequency of use - if you need to use it more than twice per week on a consistent basis let us know.   Penicillin allergy Past history - Rash/swelling as a child.  Consider penicillin allergy skin testing and in office drug challenge in the future.  Over  90% of people with history of penicillin allergy which occurred over 10 years ago are found to be non-allergic.   Return in about 6 months (around 03/24/2024).  Meds  ordered this encounter  Medications   Olopatadine HCl 0.7 % SOLN    Sig: Apply 1 drop to eye daily as needed (itchy/watery eyes).    Dispense:  2.5 mL    Refill:  5   Lab Orders  No laboratory test(s) ordered today    Diagnostics: Skin Testing: Environmental allergy panel. Today's skin testing positive to grass, ragweed, weed, trees, mold, dust mites, cat, dog, cockroach. Borderline positive to feathers and horse.  Results discussed with patient/family.  Airborne Adult Perc - 09/24/23 1451     Time Antigen Placed 1451    Allergen Manufacturer Waynette Buttery    Location Back    Number of Test 55    1. Control-Buffer 50% Glycerol Negative    2. Control-Histamine 3+    3. Bahia 2+    4. [place] Negative    5. Johnson 2+    6. Kentucky Blue 2+    7. Meadow Fescue 2+    8. Perennial Rye 2+    9. Timothy 2+    10. Ragweed Mix 3+    11. Cocklebur 3+    12. Plantain,  English Negative    13. Baccharis 2+    14. Dog Fennel 2+    15. Guernsey Thistle 2+    16. Lamb's Quarters Negative    17. Sheep Sorrell 3+    18. Rough Pigweed 2+    19. Marsh Elder, Rough Negative    20. Mugwort, Common 2+    21. Box, Elder 2+    22. Cedar, red 2+    23. Sweet Gum Negative    24. Pecan Pollen 2+    25. Pine Mix 2+    26. Walnut, Black Pollen 3+    27. Red Mulberry 2+    28. Ash Mix 3+    29. Birch Mix 3+    30. Beech American 2+    31. Cottonwood, Guinea[place]iss[place] 2+    32. Hickory, White 2+    33. Maple Mix 2+    34. Oak, [place] Mix 2+    35. Sycamore Eastern 2+    36. Alternaria Alternata 3+    37. Cladosporium Herbarum 2+    38. Aspergillus Mix 3+    39. Penicillium Mix 2+    40. Bipolaris Sorokiniana (Helminthosporium) 3+    41. Drechslera Spicifera (Curvularia) 2+    42. Mucor Plumbeus 2+    43. Fusarium Moniliforme Negative    44. Aureobasidium Pullulans (pullulara) 3+    45. Rhizopus Oryzae Negative    46. Botrytis Cinera Negative    47. Epicoccum Nigrum 2+    48. Phoma Betae 2+     49. Dust Mite Mix 2+    50. Cat Hair 10,000 BAU/ml 2+    51.  Dog Epithelia 2+    52. Mixed Feathers --   +/-   53. Horse Epithelia --   +/-   54. Cockroach, German 2+    55. Tobacco Leaf Negative             Intradermal - 09/24/23 1534     Time Antigen Placed 0320    Allergen Manufacturer Waynette Buttery    Location Arm    Number of Test 2    Intradermal Select    Control Negative    [place] Negative  Previous notes and tests were reviewed. The plan was reviewed with the patient/family, and all questions/concerned were addressed.  It was my pleasure to see Dana Underwood today and participate in her care. Please feel free to contact me with any questions or concerns.  Sincerely,  Wyline Mood, DO Allergy & Immunology  [facility] office: [phone] [facility] office: [phone]

## 2023-09-24 ENCOUNTER — Ambulatory Visit: Payer: Commercial Managed Care - PPO | Admitting: Allergy

## 2023-09-24 ENCOUNTER — Other Ambulatory Visit: Payer: Self-pay | Admitting: Family

## 2023-09-24 ENCOUNTER — Encounter: Payer: Self-pay | Admitting: Allergy

## 2023-09-24 VITALS — BP 108/70 | HR 79

## 2023-09-24 DIAGNOSIS — F900 Attention-deficit hyperactivity disorder, predominantly inattentive type: Secondary | ICD-10-CM

## 2023-09-24 DIAGNOSIS — Z880 Allergy status to penicillin: Secondary | ICD-10-CM

## 2023-09-24 DIAGNOSIS — Z91038 Other insect allergy status: Secondary | ICD-10-CM

## 2023-09-24 DIAGNOSIS — H1013 Acute atopic conjunctivitis, bilateral: Secondary | ICD-10-CM

## 2023-09-24 DIAGNOSIS — J3089 Other allergic rhinitis: Secondary | ICD-10-CM | POA: Diagnosis not present

## 2023-09-24 DIAGNOSIS — J3081 Allergic rhinitis due to animal (cat) (dog) hair and dander: Secondary | ICD-10-CM

## 2023-09-24 DIAGNOSIS — J301 Allergic rhinitis due to pollen: Secondary | ICD-10-CM

## 2023-09-24 DIAGNOSIS — J4520 Mild intermittent asthma, uncomplicated: Secondary | ICD-10-CM

## 2023-09-24 MED ORDER — OLOPATADINE HCL 0.7 % OP SOLN: 1.0000 [drp] | Freq: Every day | OPHTHALMIC | 5 refills | Status: AC | PRN

## 2023-09-24 NOTE — Patient Instructions (Signed)
Today's skin testing positive to grass, ragweed, weed, trees, mold, dust mites, cat, dog, cockroach. Borderline positive to feathers and horse.   Results given.  Environmental allergies Start environmental control measures as below. Use over the counter antihistamines such as Zyrtec (cetirizine), Claritin (loratadine), Allegra (fexofenadine), or Xyzal (levocetirizine) daily as needed. May take twice a day during allergy flares. May switch antihistamines every few months. Consider allergy injections for long term control if above medications do not help the symptoms - handout given.  2 shots Let us know when ready to start.   Continue Ryaltris (olopatadine + mometasone nasal spray combination) 1-2 sprays per nostril twice a day.  This replaces your other nasal sprays. If this works well for you, then have [facility] ship the medication to your home - prescription already sent in.  Nasal saline spray (i.e., Simply Saline) or nasal saline lavage (i.e., NeilMed) is recommended as needed and prior to medicated nasal sprays. Use olopatadine 0.7% 1 drop in each eye daily as needed for itchy/watery eyes.  If not covered, you may have to get it OTC.  Asthma May use Airsupra rescue inhaler 2 puffs every 4 to 6 hours as needed for shortness of breath, chest tightness, coughing, and wheezing. Do not use more than 12 puffs in 24 hours.  May use Airsupra rescue inhaler 2 puffs 5 to 15 minutes prior to strenuous physical activities. Rinse mouth after each use.  Monitor frequency of use - if you need to use it more than twice per week on a consistent basis let us know.   Penicillin allergy Consider penicillin allergy skin testing and in office drug challenge in the future.  Over 90% of people with history of penicillin allergy which occurred over 10 years ago are found to be non-allergic.   Return in about 6 months (around 03/24/2024). Or sooner if needed.   Reducing Pollen Exposure Pollen seasons: trees  (spring), grass (summer) and ragweed/weeds (fall). Keep windows closed in your home and car to lower pollen exposure.  Install air conditioning in the bedroom and throughout the house if possible.  Avoid going out in dry windy days - especially early morning. Pollen counts are highest between 5 - 10 AM and on dry, hot and windy days.  Save outside activities for late afternoon or after a heavy rain, when pollen levels are lower.  Avoid mowing of grass if you have grass pollen allergy. Be aware that pollen can also be transported indoors on people and pets.  Dry your clothes in an automatic dryer rather than hanging them outside where they might collect pollen.  Rinse hair and eyes before bedtime. Mold Control Mold and fungi can grow on a variety of surfaces provided certain temperature and moisture conditions exist.  Outdoor molds grow on plants, decaying vegetation and soil. The major outdoor mold, Alternaria and Cladosporium, are found in very high numbers during hot and dry conditions. Generally, a late summer - fall peak is seen for common outdoor fungal spores. Rain will temporarily lower outdoor mold spore count, but counts rise rapidly when the rainy period ends. The most important indoor molds are Aspergillus and Penicillium. Dark, humid and poorly ventilated basements are ideal sites for mold growth. The next most common sites of mold growth are the bathroom and the kitchen. Outdoor (Seasonal) Mold Control Use air conditioning and keep windows closed. Avoid exposure to decaying vegetation. Avoid leaf raking. Avoid grain handling. Consider wearing a face mask if working in moldy areas.  Indoor (  Perennial) Mold Control  Maintain humidity below 50%. Get rid of mold growth on hard surfaces with water, detergent and, if necessary, 5% bleach (do not mix with other cleaners). Then dry the area completely. If mold covers an area more than 10 square feet, consider hiring an indoor environmental  professional. For clothing, washing with soap and water is best. If moldy items cannot be cleaned and dried, throw them away. Remove sources e.g. contaminated carpets. Repair and seal leaking roofs or pipes. Using dehumidifiers in damp basements may be helpful, but empty the water and clean units regularly to prevent mildew from forming. All rooms, especially basements, bathrooms and kitchens, require ventilation and cleaning to deter mold and mildew growth. Avoid carpeting on concrete or damp floors, and storing items in damp areas. Control of House Dust Mite Allergen Dust mite allergens are a common trigger of allergy and asthma symptoms. While they can be found throughout the house, these microscopic creatures thrive in warm, humid environments such as bedding, upholstered furniture and carpeting. Because so much time is spent in the bedroom, it is essential to reduce mite levels there.  Encase pillows, mattresses, and box springs in special allergen-proof fabric covers or airtight, zippered plastic covers.  Bedding should be washed weekly in hot water (130 F) and dried in a hot dryer. Allergen-proof covers are available for comforters and pillows that can't be regularly washed.  Wash the allergy-proof covers every few months. Minimize clutter in the bedroom. Keep pets out of the bedroom.  Keep humidity less than 50% by using a dehumidifier or air conditioning. You can buy a humidity measuring device called a hygrometer to monitor this.  If possible, replace carpets with hardwood, linoleum, or washable area rugs. If that's not possible, vacuum frequently with a vacuum that has a HEPA filter. Remove all upholstered furniture and non-washable window drapes from the bedroom. Remove all non-washable stuffed toys from the bedroom.  Wash stuffed toys weekly. Pet Allergen Avoidance: Contrary to popular opinion, there are no "hypoallergenic" breeds of dogs or cats. That is because people are not allergic  to an animal's hair, but to an allergen found in the animal's saliva, dander (dead skin flakes) or urine. Pet allergy symptoms typically occur within minutes. For some people, symptoms can build up and become most severe 8 to 12 hours after contact with the animal. People with severe allergies can experience reactions in public places if dander has been transported on the pet owners' clothing. Keeping an animal outdoors is only a partial solution, since homes with pets in the yard still have higher concentrations of animal allergens. Before getting a pet, ask your allergist to determine if you are allergic to animals. If your pet is already considered part of your family, try to minimize contact and keep the pet out of the bedroom and other rooms where you spend a great deal of time. As with dust mites, vacuum carpets often or replace carpet with a hardwood floor, tile or linoleum. High-efficiency particulate air (HEPA) cleaners can reduce allergen levels over time. While dander and saliva are the source of cat and dog allergens, urine is the source of allergens from rabbits, hamsters, mice and [place] pigs; so ask a non-allergic family member to clean the animal's cage. If you have a pet allergy, talk to your allergist about the potential for allergy immunotherapy (allergy shots). This strategy can often provide long-term relief. Cockroach Allergen Avoidance Cockroaches are often found in the homes of densely populated urban areas,  schools or commercial buildings, but these creatures can lurk almost anywhere. This does not mean that you have a dirty house or living area. Block all areas where roaches can enter the home. This includes crevices, wall cracks and windows.  Cockroaches need water to survive, so fix and seal all leaky faucets and pipes. Have an exterminator go through the house when your family and pets are gone to eliminate any remaining roaches. Keep food in lidded containers and put pet food  dishes away after your pets are done eating. Vacuum and sweep the floor after meals, and take out garbage and recyclables. Use lidded garbage containers in the kitchen. Wash dishes immediately after use and clean under stoves, refrigerators or toasters where crumbs can accumulate. Wipe off the stove and other kitchen surfaces and cupboards regularly.

## 2023-09-25 MED ORDER — AMPHETAMINE-DEXTROAMPHETAMINE 20 MG PO TABS: 20.0000 mg | ORAL_TABLET | Freq: Every day | ORAL | 0 refills | Status: DC

## 2023-10-24 ENCOUNTER — Other Ambulatory Visit: Payer: Self-pay | Admitting: Family

## 2023-10-24 DIAGNOSIS — F411 Generalized anxiety disorder: Secondary | ICD-10-CM

## 2023-10-28 ENCOUNTER — Other Ambulatory Visit: Payer: Self-pay | Admitting: Family

## 2023-10-28 DIAGNOSIS — F900 Attention-deficit hyperactivity disorder, predominantly inattentive type: Secondary | ICD-10-CM

## 2023-10-30 ENCOUNTER — Other Ambulatory Visit: Payer: Self-pay | Admitting: Family

## 2023-10-30 DIAGNOSIS — F900 Attention-deficit hyperactivity disorder, predominantly inattentive type: Secondary | ICD-10-CM

## 2023-10-31 ENCOUNTER — Telehealth: Payer: Self-pay | Admitting: Family

## 2023-10-31 DIAGNOSIS — F900 Attention-deficit hyperactivity disorder, predominantly inattentive type: Secondary | ICD-10-CM

## 2023-10-31 MED ORDER — AMPHETAMINE-DEXTROAMPHETAMINE 20 MG PO TABS: 20.0000 mg | ORAL_TABLET | Freq: Every day | ORAL | 0 refills | Status: DC

## 2023-10-31 NOTE — Telephone Encounter (Signed)
needs appt - can be virtual!

## 2023-10-31 NOTE — Telephone Encounter (Signed)
Prescription Request  10/31/2023  LOV: 06/07/2023  What is the name of the medication or equipment? amphetamine-dextroamphetamine (ADDERALL) 20 MG tablet    Have you contacted your pharmacy to request a refill? Yes   Which pharmacy would you like this sent to?  [id number] - [place], [place] - [address] [place] [address] Phone: [phone] Fax: [phone]  Patient notified that their request is being sent to the clinical staff for review and that they should receive a response within 2 business days.   Please advise at Mobile [phone] (mobile)

## 2023-10-31 NOTE — Telephone Encounter (Signed)
RX sent, but must keep appt monday

## 2023-10-31 NOTE — Telephone Encounter (Signed)
Patient is scheduled for a Virtual on Monday

## 2023-11-01 ENCOUNTER — Other Ambulatory Visit: Payer: Self-pay | Admitting: Family

## 2023-11-01 DIAGNOSIS — F900 Attention-deficit hyperactivity disorder, predominantly inattentive type: Secondary | ICD-10-CM

## 2023-11-01 NOTE — Telephone Encounter (Signed)
refill sent yesterday

## 2023-11-01 NOTE — Telephone Encounter (Signed)
Patient comment: I am completely out & leaving town this afternoon. I have appt scheduled monday. Can i get the refill sent today?

## 2023-11-04 ENCOUNTER — Encounter: Payer: Self-pay | Admitting: Family

## 2023-11-04 ENCOUNTER — Telehealth: Payer: Commercial Managed Care - PPO | Admitting: Family

## 2023-11-04 VITALS — Wt 193.0 lb

## 2023-11-04 DIAGNOSIS — F900 Attention-deficit hyperactivity disorder, predominantly inattentive type: Secondary | ICD-10-CM

## 2023-11-04 MED ORDER — AMPHETAMINE-DEXTROAMPHETAMINE 20 MG PO TABS: 20.0000 mg | ORAL_TABLET | Freq: Every day | ORAL | 0 refills | Status: DC

## 2023-11-04 NOTE — Assessment & Plan Note (Signed)
chronic, stable unable to get Vyvanse, pt went back to Adderall 20mg  IR qd states she is doing fine, denies SE sending refill for 90 pills f/u in 3-4 month

## 2023-11-04 NOTE — Progress Notes (Deleted)
Patient ID: Dana Underwood, female    DOB: October 10, 1981, 42 y.o.   MRN: [id number]  Chief Complaint  Patient presents with   Medication Management    Refill needed on Adderall    Allergies   Depression   Asthma   HPI: ADHD:  having trouble with organizing, easily distracted, difficulty focusing. Reports having similar sx in college, but never tested for ADHD, she just pushed through and worked/studied much longer than friends. Reports seeing therapist who tested her for ADHD d/t her continued anxiety sx. Hx of taking Vyvanse 40mg  which worked well for her, but had to switch to Adderall d/t med shortage. We tried XR and then IR bid, but she states neither worked as well as the Vyvanse, wanted to get back on this, however, she was unable to find in stock and so had to go back to Adderall IR 20mg  qd, this seems to be working for her and not needing the 2nd dose.     Assessment & Plan:  ADHD (attention deficit hyperactivity disorder), inattentive type Assessment & Plan: chronic, stable unable to get Vyvanse, pt went back to Adderall 20mg  IR qd states she is doing fine, denies SE sending refill for 90 pills f/u in 3-4 month     Subjective:    Outpatient Medications Prior to Visit  Medication Sig Dispense Refill   amphetamine-dextroamphetamine (ADDERALL) 20 MG tablet Take 1 tablet (20 mg total) by mouth daily before breakfast. 30 tablet 0   FLUoxetine (PROZAC) 10 MG tablet Take 1.5 tablets (15 mg total) by mouth daily. 135 tablet 1   fluticasone (FLONASE) 50 MCG/ACT nasal spray SHAKE LIQUID AND USE 2 SPRAYS IN EACH NOSTRIL DAILY     levocetirizine (XYZAL) 5 MG tablet Take 1 tablet (5 mg total) by mouth every evening. 30 tablet 5   levothyroxine (SYNTHROID) 50 MCG tablet Take 50 mcg by mouth daily before breakfast.     liothyronine (CYTOMEL) 5 MCG tablet Take by mouth.     Multiple Vitamin (MULTIVITAMIN) tablet Take 1 tablet by mouth daily.     Olopatadine HCl 0.7 % SOLN Apply 1 drop to  eye daily as needed (itchy/watery eyes). 2.5 mL 5   progesterone (PROMETRIUM) 200 MG capsule Take 1 tablet by mouth daily.     Albuterol-Budesonide (AIRSUPRA) 90-80 MCG/ACT AERO Inhale 2 puffs into the lungs every 4 (four) hours as needed (coughing, wheezing, chest tightness). Do not exceed 12 puffs in 24 hours. (Patient not taking: Reported on 11/04/2023) 10.7 g 2   cetirizine (ZYRTEC ALLERGY) 10 MG tablet Take 1 tablet (10 mg total) by mouth at bedtime. 30 tablet 2   Olopatadine-Mometasone (RYALTRIS) 665-25 MCG/ACT SUSP Place 1-2 sprays into the nose in the morning and at bedtime. (Patient not taking: Reported on 11/04/2023) 29 g 5   No facility-administered medications prior to visit.   Past Medical History:  Diagnosis Date   Allergy    Anxiety    Asthma    Depression    Endometriosis 2016   2019   Thyroid disease    Past Surgical History:  Procedure Laterality Date   ADENOIDECTOMY     TONSILLECTOMY     Allergies  Allergen Reactions   Penicillins Dermatitis, Hives, Itching, Rash and Swelling      Objective:    Physical Exam Vitals and nursing note reviewed.  Constitutional:      Appearance: Normal appearance.  Cardiovascular:     Rate and Rhythm: Normal rate and regular rhythm.  Pulmonary:     Effort: Pulmonary effort is normal.     Breath sounds: Normal breath sounds.  Musculoskeletal:        General: Normal range of motion.  Skin:    General: Skin is warm and dry.  Neurological:     Mental Status: She is alert.  Psychiatric:        Mood and Affect: Mood normal.        Behavior: Behavior normal.    Wt 193 lb (87.5 kg)   BMI 30.23 kg/m  Wt Readings from Last 3 Encounters:  11/04/23 193 lb (87.5 kg)  09/05/23 196 lb (88.9 kg)  06/07/23 192 lb 12.8 oz (87.5 kg)       Dulce Sellar, NP

## 2023-11-13 NOTE — Progress Notes (Signed)
MyChart Video Visit    Virtual Visit via Video Note   This format is felt to be most appropriate for this patient at this time. Physical exam was limited by quality of the video and audio technology used for the visit. CMA was able to get the patient set up on a video visit.  Patient location: Home. Patient and provider in visit Provider location: Office  I discussed the limitations of evaluation and management by telemedicine and the availability of in person appointments. The patient expressed understanding and agreed to proceed.  Visit Date: 11/04/2023  Today's healthcare provider: Dulce Sellar, NP     Subjective:   Patient ID: Dana Underwood, female    DOB: [birth year], 42 y.o.   MRN: [id number]  Chief Complaint  Patient presents with   Medication Management    Refill needed on Adderall    Allergies   Depression   Asthma   ADHD:  having trouble with organizing, easily distracted, difficulty focusing. Reports having similar sx in college, but never tested for ADHD, she just pushed through and worked/studied much longer than friends. Reports seeing therapist who tested her for ADHD d/t her continued anxiety sx. Hx of taking Vyvanse 40mg  which worked well for her, but had to switch to Adderall d/t med shortage. We tried XR and then IR bid, but she states neither worked as well as the Vyvanse, wanted to get back on this, however, she was unable to find in stock and so had to go back to Adderall IR 20mg  qd, this seems to be working for her and not needing the 2nd dose.     Assessment & Plan:  ADHD (attention deficit hyperactivity disorder), inattentive type Assessment & Plan: chronic, stable unable to get Vyvanse, pt went back to Adderall 20mg  IR qd states she is doing fine, denies SE sending refill for 90 pills f/u in 3-4 month   Orders: -     Amphetamine-Dextroamphetamine; Take 1 tablet (20 mg total) by mouth daily after breakfast.  Dispense: 90 tablet; Refill:  0   Past Medical History:  Diagnosis Date   Allergy    Anxiety    Asthma    Depression    Endometriosis 2016   2019   Thyroid disease     Past Surgical History:  Procedure Laterality Date   ADENOIDECTOMY     TONSILLECTOMY      Outpatient Medications Prior to Visit  Medication Sig Dispense Refill   FLUoxetine (PROZAC) 10 MG tablet Take 1.5 tablets (15 mg total) by mouth daily. 135 tablet 1   fluticasone (FLONASE) 50 MCG/ACT nasal spray SHAKE LIQUID AND USE 2 SPRAYS IN EACH NOSTRIL DAILY     levocetirizine (XYZAL) 5 MG tablet Take 1 tablet (5 mg total) by mouth every evening. 30 tablet 5   levothyroxine (SYNTHROID) 50 MCG tablet Take 50 mcg by mouth daily before breakfast.     liothyronine (CYTOMEL) 5 MCG tablet Take by mouth.     Multiple Vitamin (MULTIVITAMIN) tablet Take 1 tablet by mouth daily.     Olopatadine HCl 0.7 % SOLN Apply 1 drop to eye daily as needed (itchy/watery eyes). 2.5 mL 5   progesterone (PROMETRIUM) 200 MG capsule Take 1 tablet by mouth daily.     amphetamine-dextroamphetamine (ADDERALL) 20 MG tablet Take 1 tablet (20 mg total) by mouth daily before breakfast. 30 tablet 0   Albuterol-Budesonide (AIRSUPRA) 90-80 MCG/ACT AERO Inhale 2 puffs into the lungs every 4 (four) hours as needed (  coughing, wheezing, chest tightness). Do not exceed 12 puffs in 24 hours. (Patient not taking: Reported on 11/04/2023) 10.7 g 2   cetirizine (ZYRTEC ALLERGY) 10 MG tablet Take 1 tablet (10 mg total) by mouth at bedtime. 30 tablet 2   Olopatadine-Mometasone (RYALTRIS) 665-25 MCG/ACT SUSP Place 1-2 sprays into the nose in the morning and at bedtime. (Patient not taking: Reported on 11/04/2023) 29 g 5   No facility-administered medications prior to visit.    Allergies  Allergen Reactions   Penicillins Dermatitis, Hives, Itching, Rash and Swelling       Objective:   Physical Exam Vitals and nursing note reviewed.  Constitutional:      General: Pt is not in acute distress.     Appearance: Normal appearance.  HENT:     Head: Normocephalic.  Pulmonary:     Effort: No respiratory distress.  Musculoskeletal:     Cervical back: Normal range of motion.  Skin:    General: Skin is dry.     Coloration: Skin is not pale.  Neurological:     Mental Status: Pt is alert and oriented to person, place, and time.  Psychiatric:        Mood and Affect: Mood normal.   Wt 193 lb (87.5 kg)   BMI 30.23 kg/m   Wt Readings from Last 3 Encounters:  11/04/23 193 lb (87.5 kg)  09/05/23 196 lb (88.9 kg)  06/07/23 192 lb 12.8 oz (87.5 kg)      I discussed the assessment and treatment plan with the patient. The patient was provided an opportunity to ask questions and all were answered. The patient agreed with the plan and demonstrated an understanding of the instructions.   The patient was advised to call back or seek an in-person evaluation if the symptoms worsen or if the condition fails to improve as anticipated.  Dulce Sellar, NP [facility] [phone] (phone) [phone] (fax)  [facility]

## 2023-12-25 ENCOUNTER — Other Ambulatory Visit: Payer: Self-pay | Admitting: Nurse Practitioner

## 2023-12-25 DIAGNOSIS — J45909 Unspecified asthma, uncomplicated: Secondary | ICD-10-CM

## 2024-01-01 ENCOUNTER — Other Ambulatory Visit: Payer: Self-pay | Admitting: Family

## 2024-01-01 DIAGNOSIS — F411 Generalized anxiety disorder: Secondary | ICD-10-CM

## 2024-02-02 ENCOUNTER — Other Ambulatory Visit: Payer: Self-pay | Admitting: Family

## 2024-02-02 DIAGNOSIS — F411 Generalized anxiety disorder: Secondary | ICD-10-CM

## 2024-02-10 ENCOUNTER — Encounter: Payer: Self-pay | Admitting: Family

## 2024-02-10 ENCOUNTER — Ambulatory Visit: Admitting: Family

## 2024-02-10 VITALS — BP 114/77 | HR 73 | Temp 98.1°F | Ht 67.0 in | Wt 198.2 lb

## 2024-02-10 DIAGNOSIS — F900 Attention-deficit hyperactivity disorder, predominantly inattentive type: Secondary | ICD-10-CM | POA: Diagnosis not present

## 2024-02-10 DIAGNOSIS — F411 Generalized anxiety disorder: Secondary | ICD-10-CM

## 2024-02-10 MED ORDER — LISDEXAMFETAMINE DIMESYLATE 40 MG PO CAPS: 40.0000 mg | ORAL_CAPSULE | Freq: Every day | ORAL | 0 refills | Status: DC

## 2024-02-10 MED ORDER — FLUOXETINE HCL 10 MG PO TABS
15.0000 mg | ORAL_TABLET | Freq: Every day | ORAL | 1 refills | Status: DC
Start: 2024-02-10 — End: 2024-03-16

## 2024-02-10 NOTE — Assessment & Plan Note (Signed)
 She has not been on Vyvanse due to shortages but plans to try a different pharmacy. - Prescribe Vyvanse 40 mg qd for next 3 mos. -F/U in 3 mos in office or virtual.

## 2024-02-10 NOTE — Assessment & Plan Note (Signed)
 She was off Prozac due to a pharmacy error but wishes to continue 15 mg dose. - Refilling Prozac 15 mg qd  - Ensure pharmacy removes 10 mg capsules from her medication list. -F/U in 6 mos or prn

## 2024-02-10 NOTE — Progress Notes (Signed)
 Patient ID: Dana Underwood, female    DOB: March 18, 1981, 43 y.o.   MRN: [id number]  Chief Complaint  Patient presents with   ADHD (attention deficit hyperactivity disorder), inattentiv    Pt would like to discuss vyvanse.        Discussed the use of AI scribe software for clinical note transcription with the patient, who gave verbal consent to proceed.  History of Present Illness   The patient, with a history of ADHD and depression, presents for medication refills. She has been off Vyvanse for over six months due to a shortage,  and switched to Adderall XR, but she prefers the Vyvanse, and would like to restart. She was previously on a 40mg  dose. She also needs a refill of Prozac. She was previously on a 15mg  dose of tablets, but due to a pharmacy error, she was given 10mg  capsules. She has been off Prozac for several days, but has not noticed any adverse effects. She would like to continue on the 15mg  dose as she felt she was doing well on it.      Assessment & Plan:     Attention Deficit Hyperactivity Disorder (ADHD)- She has not been on Vyvanse due to shortages but plans to try a different pharmacy. - Prescribe Vyvanse 40 mg qd for next 3 mos. -F/U in 3 mos in office or virtual.  Anxiety - She was off Prozac due to a pharmacy error but wishes to continue 15 mg dose. - Refilling Prozac 15 mg qd  - Ensure pharmacy removes 10 mg capsules from her medication list. -F/U in 6 mos or prn      Subjective:    Outpatient Medications Prior to Visit  Medication Sig Dispense Refill   amphetamine-dextroamphetamine (ADDERALL) 20 MG tablet Take 1 tablet (20 mg total) by mouth daily after breakfast. 90 tablet 0   FLUoxetine (PROZAC) 10 MG tablet Take 1.5 tablets (15 mg total) by mouth daily. 135 tablet 1   fluticasone (FLONASE) 50 MCG/ACT nasal spray SHAKE LIQUID AND USE 2 SPRAYS IN EACH NOSTRIL DAILY     levocetirizine (XYZAL) 5 MG tablet Take 1 tablet (5 mg total) by mouth every evening.  (Patient taking differently: Take 5 mg by mouth every evening. OTC) 30 tablet 5   levothyroxine (SYNTHROID) 50 MCG tablet Take 50 mcg by mouth daily before breakfast.     liothyronine (CYTOMEL) 5 MCG tablet Take by mouth.     Olopatadine HCl 0.7 % SOLN Apply 1 drop to eye daily as needed (itchy/watery eyes). 2.5 mL 5   Olopatadine-Mometasone (RYALTRIS) 665-25 MCG/ACT SUSP Place 1-2 sprays into the nose in the morning and at bedtime. 29 g 5   progesterone (PROMETRIUM) 100 MG capsule Take 100 mg by mouth daily. 6 weeks,     Albuterol-Budesonide (AIRSUPRA) 90-80 MCG/ACT AERO Inhale 2 puffs into the lungs every 4 (four) hours as needed (coughing, wheezing, chest tightness). Do not exceed 12 puffs in 24 hours. (Patient not taking: Reported on 02/10/2024) 10.7 g 2   cetirizine (ZYRTEC ALLERGY) 10 MG tablet Take 1 tablet (10 mg total) by mouth at bedtime. 30 tablet 2   FLUoxetine (PROZAC) 10 MG capsule TAKE 1 CAPSULE(10 MG) BY MOUTH DAILY (Patient not taking: Reported on 02/10/2024) 30 capsule 0   Multiple Vitamin (MULTIVITAMIN) tablet Take 1 tablet by mouth daily. (Patient not taking: Reported on 02/10/2024)     No facility-administered medications prior to visit.   Past Medical History:  Diagnosis Date  Allergy    Anxiety    Asthma    Depression    Endometriosis 2016   2019   Thyroid disease    Past Surgical History:  Procedure Laterality Date   ADENOIDECTOMY     TONSILLECTOMY     Allergies  Allergen Reactions   Penicillins Dermatitis, Hives, Itching, Rash and Swelling      Objective:    Physical Exam Vitals and nursing note reviewed.  Constitutional:      Appearance: Normal appearance.  Cardiovascular:     Rate and Rhythm: Normal rate and regular rhythm.  Pulmonary:     Effort: Pulmonary effort is normal.     Breath sounds: Normal breath sounds.  Musculoskeletal:        General: Normal range of motion.  Skin:    General: Skin is warm and dry.  Neurological:     Mental  Status: She is alert.  Psychiatric:        Mood and Affect: Mood normal.        Behavior: Behavior normal.    BP 114/77 (BP Location: Left Arm, Patient Position: Sitting, Cuff Size: Large)   Pulse 73   Temp 98.1 F (36.7 C) (Temporal)   Ht 5\' 7"  (1.702 m)   Wt 198 lb 3.2 oz (89.9 kg)   LMP 01/13/2024 (Exact Date)   SpO2 97%   BMI 31.04 kg/m  Wt Readings from Last 3 Encounters:  02/10/24 198 lb 3.2 oz (89.9 kg)  11/04/23 193 lb (87.5 kg)  09/05/23 196 lb (88.9 kg)      Dulce Sellar, NP

## 2024-02-12 ENCOUNTER — Other Ambulatory Visit: Payer: Self-pay

## 2024-02-12 ENCOUNTER — Encounter: Payer: Self-pay | Admitting: Family

## 2024-03-12 ENCOUNTER — Encounter: Payer: Self-pay | Admitting: Family

## 2024-03-12 DIAGNOSIS — F411 Generalized anxiety disorder: Secondary | ICD-10-CM

## 2024-03-16 MED ORDER — FLUOXETINE HCL 20 MG PO TABS: 20.0000 mg | ORAL_TABLET | Freq: Every day | ORAL | 1 refills | Status: DC

## 2024-03-24 ENCOUNTER — Ambulatory Visit: Payer: Commercial Managed Care - PPO | Admitting: Family Medicine

## 2024-03-24 ENCOUNTER — Encounter: Payer: Self-pay | Admitting: Family Medicine

## 2024-03-24 ENCOUNTER — Other Ambulatory Visit: Payer: Self-pay

## 2024-03-24 VITALS — BP 128/76 | HR 87 | Temp 98.6°F | Resp 20 | Wt 194.8 lb

## 2024-03-24 DIAGNOSIS — J4520 Mild intermittent asthma, uncomplicated: Secondary | ICD-10-CM | POA: Diagnosis not present

## 2024-03-24 DIAGNOSIS — J3089 Other allergic rhinitis: Secondary | ICD-10-CM

## 2024-03-24 DIAGNOSIS — H1013 Acute atopic conjunctivitis, bilateral: Secondary | ICD-10-CM | POA: Diagnosis not present

## 2024-03-24 DIAGNOSIS — J302 Other seasonal allergic rhinitis: Secondary | ICD-10-CM | POA: Insufficient documentation

## 2024-03-24 DIAGNOSIS — Z880 Allergy status to penicillin: Secondary | ICD-10-CM | POA: Diagnosis not present

## 2024-03-24 MED ORDER — CROMOLYN SODIUM 4 % OP SOLN: 2.0000 [drp] | Freq: Four times a day (QID) | OPHTHALMIC | 5 refills | Status: DC | PRN

## 2024-03-24 MED ORDER — EPINEPHRINE 0.3 MG/0.3ML IJ SOAJ: 0.3000 mg | Freq: Once | INTRAMUSCULAR | 2 refills | Status: AC

## 2024-03-24 MED ORDER — CARBINOXAMINE MALEATE 4 MG PO TABS: ORAL_TABLET | ORAL | 5 refills | Status: DC

## 2024-03-24 NOTE — Progress Notes (Signed)
 [address] [place] [address] Dept: [phone]  FOLLOW UP NOTE  Patient ID: Dana Underwood, female    DOB: 21-Oct-1981  Age: 43 y.o. MRN: [id number] Date of Office Visit: 03/24/2024  Assessment  Chief Complaint: Follow-up (One month follow up ready to discuss injections. Has done them in past. )  HPI Dana Underwood is a 43 year old female who presents to the clinic for a follow-up visit.  She was last seen in this clinic on 09/24/2023 by Dr. Burdette Carolin for evaluation of asthma, allergic rhinitis, allergic conjunctivitis, and penicillin allergy .  At today's visit, she reports her asthma has been moderately well-controlled with shortness of breath occurring only after spending time outside as well as cough producing clear mucus that began about 2 weeks ago.  She continues Airsupra  infrequently with relief of symptoms.  Allergic rhinitis is reported as poorly controlled with symptoms including clear rhinorrhea, nasal congestion, sneezing, and postnasal drainage.  She continues fluticasone , saline rinses via Nettie pot, and antihistamine once or twice a day with only moderate relief of symptoms.  Her last environmental allergy  skin testing was on 09/24/2023 and was positive to grass pollen, ragweed pollen, weed pollen, tree pollen, mold, dust mite, cat, dog, and cockroach.  She is interested in beginning allergen immunotherapy. She reports that she did receive allergen immunotherapy when living [place] different [place] over 3 [place] ago with no large or local reactions.  Allergic conjunctivitis is reported as poorly controlled with symptoms including red and itchy eyes and some swelling around her eyes.  She continues olopatadine  0.7% once or twice a day with no relief of symptoms.  She denies vision changes or eye pain.   She continues to avoid penicillin.  Her current medications are listed in the chart.  Drug Allergies:  Allergies  Allergen Reactions   Penicillins Dermatitis, Hives,  Itching, Rash and Swelling    Physical Exam: BP 128/76   Pulse 87   Temp 98.6 F (37 C) (Temporal)   Resp 20   Wt 194 lb 12.8 oz (88.4 kg)   SpO2 95%   BMI 30.51 kg/m    Physical Exam Vitals reviewed.  Constitutional:      Appearance: Normal appearance.  HENT:     Head: Normocephalic and atraumatic.     Right Ear: Tympanic membrane normal.     Left Ear: Tympanic membrane normal.     Nose:     Comments: Bilateral nares erythematous with thin clear nasal drainage noted.  Pharynx with cobblestone appearance ears normal.  Bilateral conjunctiva slightly injected. Cardiovascular:     Rate and Rhythm: Normal rate and regular rhythm.     Heart sounds: Normal heart sounds. No murmur heard. Pulmonary:     Effort: Pulmonary effort is normal.     Breath sounds: Normal breath sounds.     Comments: Lungs clear to auscultation Musculoskeletal:        General: Normal range of motion.     Cervical back: Normal range of motion and neck supple.  Skin:    General: Skin is warm and dry.  Neurological:     Mental Status: She is alert and oriented to person, place, and time.  Psychiatric:        Mood and Affect: Mood normal.        Behavior: Behavior normal.        Thought Content: Thought content normal.        Judgment: Judgment normal.     Assessment  and Plan: 1. Mild intermittent asthma without complication   2. Seasonal and perennial allergic rhinitis   3. Allergic conjunctivitis of both eyes   4. Penicillin allergy      Meds ordered this encounter  Medications   cromolyn (OPTICROM) 4 % ophthalmic solution    Sig: Place 2 drops into both eyes 4 (four) times daily as needed.    Dispense:  10 mL    Refill:  5   Carbinoxamine Maleate 4 MG TABS    Sig: Take 1 to 2 tablets twice a day if needed for nasal symptoms    Dispense:  120 tablet    Refill:  5    Patient Instructions  Allergic rhinitis Continue allergen avoidance measures directed toward grass pollen, weed pollen,  ragweed pollen, tree pollen, mold, dust mite, cat, dog, cockroach, horse, and feathers as listed below Begin carbinoxamine 4 mg tablets.  Take 1 to 2 tablets in the morning and 1 to 2 tablets in the evening if needed for nasal symptoms.  This will replace cetirizine  and levocetirizine at this time.  Remember to rotate to a different antihistamine about every 3 months. Some examples of over the counter antihistamines include Zyrtec  (cetirizine ), Xyzal  (levocetirizine), Allegra (fexofenadine), and Claritin (loratidine).  Continue Ryaltris  2 sprays in each nostril once or twice a day if needed for nasal symptoms Consider saline nasal rinses as needed for nasal symptoms. Use this before any medicated nasal sprays for best result Consider allergen immunotherapy.  Call your insurance and if still interested, call the clinic and make an appointment for your first injection.  Allergic conjunctivitis Begin cromolyn eyedrops 1 to 2 drops in each eye up to 4 times a day if needed for red or itchy eyes Consider a lubricating eyedrop as needed  Asthma Continue Airsupra  2 puffs if needed.  Do not use this medication more than 12 puffs in a 24-hour time span  Penicillin allergy  Consider skin testing to penicillin and if negative we will move forward with oral challenge to penicillin.  Call the clinic if this treatment plan is not working well for you.  Follow up in 3 months or sooner if needed.   Return in about 3 months (around 06/23/2024), or if symptoms worsen or fail to improve.    Thank you for the opportunity to care for this patient.  Please do not hesitate to contact me with questions.  Marinus Sic, FNP [facility] 

## 2024-03-24 NOTE — Patient Instructions (Addendum)
 Allergic rhinitis Continue allergen avoidance measures directed toward grass pollen, weed pollen, ragweed pollen, tree pollen, mold, dust mite, cat, dog, cockroach, horse, and feathers as listed below Begin carbinoxamine 4 mg tablets.  Take 1 to 2 tablets in the morning and 1 to 2 tablets in the evening if needed for nasal symptoms.  This will replace cetirizine  and levocetirizine at this time.  Remember to rotate to a different antihistamine about every 3 months. Some examples of over the counter antihistamines include Zyrtec  (cetirizine ), Xyzal  (levocetirizine), Allegra (fexofenadine), and Claritin (loratidine).  Continue Ryaltris  2 sprays in each nostril once or twice a day if needed for nasal symptoms Consider saline nasal rinses as needed for nasal symptoms. Use this before any medicated nasal sprays for best result Consider allergen immunotherapy.  Call your insurance and if still interested, call the clinic and make an appointment for your first injection.  Allergic conjunctivitis Begin cromolyn eyedrops 1 to 2 drops in each eye up to 4 times a day if needed for red or itchy eyes Consider a lubricating eyedrop as needed  Asthma Continue Airsupra  2 puffs if needed.  Do not use this medication more than 12 puffs in a 24-hour time span  Penicillin allergy  Consider skin testing to penicillin and if negative we will move forward with oral challenge to penicillin.  Call the clinic if this treatment plan is not working well for you.  Follow up in 3 months or sooner if needed.  Reducing Pollen Exposure The American Academy of Allergy , Asthma and Immunology suggests the following steps to reduce your exposure to pollen during allergy  seasons. Do not hang sheets or clothing out to dry; pollen may collect on these items. Do not mow lawns or spend time around freshly cut grass; mowing stirs up pollen. Keep windows closed at night.  Keep car windows closed while driving. Minimize morning  activities outdoors, a time when pollen counts are usually at their highest. Stay indoors as much as possible when pollen counts or humidity is high and on windy days when pollen tends to remain in the air longer. Use air conditioning when possible.  Many air conditioners have filters that trap the pollen spores. Use a HEPA room air filter to remove pollen form the indoor air you breathe.  Control of Mold Allergen Mold and fungi can grow on a variety of surfaces provided certain temperature and moisture conditions exist.  Outdoor molds grow on plants, decaying vegetation and soil.  The major outdoor mold, Alternaria and Cladosporium, are found in very high numbers during hot and dry conditions.  Generally, a late Summer - Fall peak is seen for common outdoor fungal spores.  Rain will temporarily lower outdoor mold spore count, but counts rise rapidly when the rainy period ends.  The most important indoor molds are Aspergillus and Penicillium.  Dark, humid and poorly ventilated basements are ideal sites for mold growth.  The next most common sites of mold growth are the bathroom and the kitchen.  Outdoor [employer] Use air conditioning and keep windows closed Avoid exposure to decaying vegetation. Avoid leaf raking. Avoid grain handling. Consider wearing a face mask if working in moldy areas.  Indoor Mold Control Maintain humidity below 50%. Clean washable surfaces with 5% bleach solution. Remove sources e.g. Contaminated carpets.   Control of Dust Mite Allergen Dust mites play a major role in allergic asthma and rhinitis. They occur in environments with high humidity wherever human skin is found. Dust mites absorb humidity from  the atmosphere (ie, they do not drink) and feed on organic matter (including shed human and animal skin). Dust mites are a microscopic type of insect that you cannot see with the naked eye. High levels of dust mites have been detected from mattresses, pillows, carpets,  upholstered furniture, bed covers, clothes, soft toys and any woven material. The principal allergen of the dust mite is found in its feces. A gram of dust may contain 1,000 mites and 250,000 fecal particles. Mite antigen is easily measured in the air during house cleaning activities. Dust mites do not bite and do not cause harm to humans, other than by triggering allergies/asthma.  Ways to decrease your exposure to dust mites in your home:  1. Encase mattresses, box springs and pillows with a mite-impermeable barrier or cover  2. Wash sheets, blankets and drapes weekly in hot water (130 F) with detergent and dry them in a dryer on the hot setting.  3. Have the room cleaned frequently with a vacuum cleaner and a damp dust-mop. For carpeting or rugs, vacuuming with a vacuum cleaner equipped with a high-efficiency particulate air (HEPA) filter. The dust mite allergic individual should not be in a room which is being cleaned and should wait 1 hour after cleaning before going into the room.  4. Do not sleep on upholstered furniture (eg, couches).  5. If possible removing carpeting, upholstered furniture and drapery from the home is ideal. Horizontal blinds should be eliminated in the rooms where the person spends the most time (bedroom, study, television room). Washable vinyl, roller-type shades are optimal.  6. Remove all non-washable stuffed toys from the bedroom. Wash stuffed toys weekly like sheets and blankets above.  7. Reduce indoor humidity to less than 50%. Inexpensive humidity monitors can be purchased at most hardware stores. Do not use a humidifier as can make the problem worse and are not recommended.  Control of Dog or Cat Allergen Avoidance is the best way to manage a dog or cat allergy . If you have a dog or cat and are allergic to dog or cats, consider removing the dog or cat from the home. If you have a dog or cat but don't want to find it a new home, or if your family wants a pet  even though someone in the household is allergic, here are some strategies that may help keep symptoms at bay:  Keep the pet out of your bedroom and restrict it to only a few rooms. Be advised that keeping the dog or cat in only one room will not limit the allergens to that room. Don't pet, hug or kiss the dog or cat; if you do, wash your hands with soap and water. High-efficiency particulate air (HEPA) cleaners run continuously in a bedroom or living room can reduce allergen levels over time. Regular use of a high-efficiency vacuum cleaner or a central vacuum can reduce allergen levels. Giving your dog or cat a bath at least once a week can reduce airborne allergen.  Control of Cockroach Allergen Cockroach allergen has been identified as an important cause of acute attacks of asthma, especially in urban settings.  There are fifty-five species of cockroach that exist in the [place] , however only three, the [place], [place] and [place] species produce allergen that can affect patients with Asthma.  Allergens can be obtained from fecal particles, egg casings and secretions from cockroaches.    Remove food sources. Reduce access to water. Seal access and entry points. Spray runways with 0.5-1%  Diazinon or Chlorpyrifos Blow boric acid power under stoves and refrigerator. Place bait stations (hydramethylnon) at feeding sites.

## 2024-04-05 ENCOUNTER — Other Ambulatory Visit: Payer: Self-pay | Admitting: Family

## 2024-04-05 DIAGNOSIS — F900 Attention-deficit hyperactivity disorder, predominantly inattentive type: Secondary | ICD-10-CM

## 2024-04-06 NOTE — Telephone Encounter (Signed)
 Medication: Vyvanse  Directions: Take 1 capsule (40 mg total) by mouth daily before breakfast  Last given: 02/10/2024 Number refills: 0 Last o/v: 02/10/2024 Follow up: F/U in 3 mos in office or virtual.  Labs:  08/22/2023  Please review refill request.

## 2024-04-07 ENCOUNTER — Encounter: Payer: Self-pay | Admitting: Family

## 2024-04-07 NOTE — Telephone Encounter (Signed)
 looks like she has the refill on file to start on 5/11?

## 2024-05-06 ENCOUNTER — Other Ambulatory Visit: Payer: Self-pay | Admitting: Allergy

## 2024-05-06 DIAGNOSIS — J3089 Other allergic rhinitis: Secondary | ICD-10-CM

## 2024-05-06 MED ORDER — EPINEPHRINE 0.3 MG/0.3ML IJ SOAJ: 0.3000 mg | INTRAMUSCULAR | 1 refills | Status: AC | PRN

## 2024-05-07 DIAGNOSIS — J301 Allergic rhinitis due to pollen: Secondary | ICD-10-CM | POA: Diagnosis not present

## 2024-05-07 NOTE — Progress Notes (Signed)
 Aeroallergen Immunotherapy  Ordering Provider: Dr. Eudelia Hero  Patient Details Name: Dana Underwood MRN: [id number] Date of Birth: 10-17-1981  Order 2 of 2  Vial Label: [id number]  0.2 ml (Volume)  1:20 Concentration -- Alternaria alternata 0.2 ml (Volume)  1:20 Concentration -- Cladosporium herbarum 0.2 ml (Volume)  1:10 Concentration -- Aspergillus mix 0.2 ml (Volume)  1:10 Concentration -- Penicillium mix 0.2 ml (Volume)  1:20 Concentration -- Bipolaris sorokiniana 0.2 ml (Volume)  1:20 Concentration -- Drechslera spicifera 0.2 ml (Volume)  1:10 Concentration -- Mucor plumbeus 0.2 ml (Volume)  1:40 Concentration -- Aureobasidium pullulans 0.2 ml (Volume)  1:40 Concentration -- Epicoccum nigrum 0.2 ml (Volume)  1:40 Concentration -- Phoma betae 0.5 ml (Volume)  1:10 Concentration -- Cat Hair 0.3 ml (Volume)  1:20 Concentration -- Cockroach, German 0.5 ml (Volume)  1:10 Concentration -- Dog Epithelia 0.5 ml (Volume)   AU Concentration -- Mite Mix (DF 5,000 & DP 5,000)   3.8  ml Extract Subtotal 1.2  ml Diluent 5.0  ml Maintenance Total  Schedule:  B Silver Vial (1:1,000,000): Schedule B (6 doses) Blue Vial (1:100,000): Schedule B (6 doses) Yellow Vial (1:10,000): Schedule B (6 doses) Green Vial (1:1,000): Schedule B (6 doses) Red Vial (1:100): Schedule A (14 doses)  Special Instructions: May come in 1-2 times a week during build up as tolerated. Once a week on red vial. Once she is on red vial #1 0.5cc go every 2 weeks, on red vial #2 0.5cc go every 4 weeks. May build up red vials faster (0.1, 0.3, 0.5).

## 2024-05-07 NOTE — Progress Notes (Signed)
 VIALS MADE 05-07-24

## 2024-05-07 NOTE — Progress Notes (Signed)
 Aeroallergen Immunotherapy  Ordering Provider: Dr. Eudelia Hero  Patient Details Name: Dana Underwood MRN: [id number] Date of Birth: 1981-11-18  Order 1 of 2  Vial Label: [id number]  0.3 ml (Volume)  BAU Concentration -- 7 Grass Mix* 100,000 ([place]  Blue, [place], [place], Perennial Rye, RedTop, Sweet Vernal, Timothy) 0.2 ml (Volume)  1:20 Concentration -- [place] 0.2 ml (Volume)  1:20 Concentration -- Johnson 0.3 ml (Volume)  1:20 Concentration -- Ragweed Mix 0.2 ml (Volume)  1:20 Concentration -- [place] Thistle 0.2 ml (Volume)  1:40 Concentration -- Baccharis 0.2 ml (Volume)  1:80 Concentration -- Dogfennel 0.5 ml (Volume)  1:20 Concentration -- Weed Mix* 0.5 ml (Volume)  1:20 Concentration -- Eastern 10 Tree Mix (also Sweet Gum) 0.2 ml (Volume)  1:20 Concentration -- Box Elder 0.2 ml (Volume)  1:10 Concentration -- Cedar, red 0.2 ml (Volume)  1:10 Concentration -- Pecan Pollen 0.2 ml (Volume)  1:10 Concentration -- Pine Mix 0.2 ml (Volume)  1:20 Concentration -- Red Mulberry 0.2 ml (Volume)  1:20 Concentration -- Walnut, Black Pollen   3.8  ml Extract Subtotal 1.2  ml Diluent 5.0  ml Maintenance Total  Schedule:  B Silver Vial (1:1,000,000): Schedule B (6 doses) Blue Vial (1:100,000): Schedule B (6 doses) Yellow Vial (1:10,000): Schedule B (6 doses) Green Vial (1:1,000): Schedule B (6 doses) Red Vial (1:100): Schedule A (14 doses)  Special Instructions: May come in 1-2 times a week during build up as tolerated. Once a week on red vial. Once she is on red vial #1 0.5cc go every 2 weeks, on red vial #2 0.5cc go every 4 weeks. May build up red vials faster (0.1, 0.3, 0.5).

## 2024-05-08 DIAGNOSIS — J3089 Other allergic rhinitis: Secondary | ICD-10-CM | POA: Diagnosis not present

## 2024-05-21 ENCOUNTER — Ambulatory Visit (INDEPENDENT_AMBULATORY_CARE_PROVIDER_SITE_OTHER)

## 2024-05-21 DIAGNOSIS — J309 Allergic rhinitis, unspecified: Secondary | ICD-10-CM | POA: Diagnosis not present

## 2024-05-28 ENCOUNTER — Ambulatory Visit (INDEPENDENT_AMBULATORY_CARE_PROVIDER_SITE_OTHER)

## 2024-05-28 DIAGNOSIS — J309 Allergic rhinitis, unspecified: Secondary | ICD-10-CM | POA: Diagnosis not present

## 2024-06-09 ENCOUNTER — Ambulatory Visit (INDEPENDENT_AMBULATORY_CARE_PROVIDER_SITE_OTHER)

## 2024-06-09 DIAGNOSIS — J309 Allergic rhinitis, unspecified: Secondary | ICD-10-CM | POA: Diagnosis not present

## 2024-06-10 ENCOUNTER — Other Ambulatory Visit: Payer: Self-pay | Admitting: Family

## 2024-06-10 DIAGNOSIS — F411 Generalized anxiety disorder: Secondary | ICD-10-CM

## 2024-06-11 ENCOUNTER — Ambulatory Visit

## 2024-06-11 DIAGNOSIS — F4323 Adjustment disorder with mixed anxiety and depressed mood: Secondary | ICD-10-CM | POA: Diagnosis not present

## 2024-06-12 ENCOUNTER — Encounter: Payer: Self-pay | Admitting: Family

## 2024-06-19 ENCOUNTER — Ambulatory Visit: Admitting: Family

## 2024-06-19 DIAGNOSIS — F411 Generalized anxiety disorder: Secondary | ICD-10-CM

## 2024-06-19 DIAGNOSIS — F900 Attention-deficit hyperactivity disorder, predominantly inattentive type: Secondary | ICD-10-CM | POA: Diagnosis not present

## 2024-06-19 MED ORDER — LISDEXAMFETAMINE DIMESYLATE 50 MG PO CAPS: 50.0000 mg | ORAL_CAPSULE | Freq: Every day | ORAL | 0 refills | Status: DC

## 2024-06-19 MED ORDER — FLUOXETINE HCL 10 MG PO TABS: 15.0000 mg | ORAL_TABLET | Freq: Every day | ORAL | Status: AC

## 2024-06-19 NOTE — Progress Notes (Signed)
 Patient ID: Dana Underwood, female    DOB: October 22, 1981, 43 y.o.   MRN: [id number]  Chief Complaint  Patient presents with   ADHD  Discussed the use of AI scribe software for clinical note transcription with the patient, who gave verbal consent to proceed.  History of Present Illness Dana Underwood is a 43 year old female with ADHD who presents with difficulty focusing and managing daily tasks. She is accompanied by her child, Briant.  Cognitive impairment and executive dysfunction - Difficulty focusing and managing daily tasks - Increased difficulty with focus and task management attributed to stress from being a full-time mother - Procrastination and difficulty completing tasks - Current regimen of Vyvanse  40 mg is not effective for focus and motivation - Previous combination of fluoxetine  and Vyvanse  was beneficial, but recent improvement is lacking - Previously switched from Adderall to Vyvanse  due to availability issues  Mood disturbance and anxiety symptoms - Takes fluoxetine  15 mg; previously increased to 20 mg but reverted due to side effects including cravings for sugary foods - Hydroxyzine tried for anxiety without effect - Has some lorazepam  from a previous prescription, likely expired and not used regularly - Interested in CBD for anxiety management; over-the-counter options tried without significant benefit  Lifestyle and coping strategies - Engages in gardening and yoga, which are beneficial for well-being - Decrease in walking heart rate by about 20 beats, attributed to these activities  Assessment & Plan Attention-Deficit/Hyperactivity Disorder (ADHD) Ongoing focus and motivation issues despite 40 mg Vyvanse . Discussed increasing to 50 mg with potential side effects. She agreed to monitor for side effects. - Increase Vyvanse  to 50 mg daily for one month. - Monitor for side effects such as anxiety, jitteriness, and elevated heart rate. - Reassess effectiveness and  tolerability after one month via MyChart - F/U 3 mos, virtual ok  Anxiety Currently on 15 mg fluoxetine . Previously increased to 20 mg but had side effects- increased sugar cravings. - Continue fluoxetine  15 mg daily, refill sent. - Consider non-pharmacological interventions for stress management, such as yoga and gardening. - F/U 6mos  General Health Maintenance Engages in physical activities with positive impact. Interested in CBD for anxiety management, aware of dosage caution. - Encourage continued engagement in physical activities like gardening and yoga. - Discuss potential use of CBD for anxiety management, emphasizing caution with dosages.   Subjective:    Outpatient Medications Prior to Visit  Medication Sig Dispense Refill   cetirizine  (ZYRTEC  ALLERGY ) 10 MG tablet Take 1 tablet (10 mg total) by mouth at bedtime. 30 tablet 2   EPINEPHrine  0.3 mg/0.3 mL IJ SOAJ injection Inject 0.3 mg into the muscle as needed for anaphylaxis. 2 each 1   FLUoxetine  (PROZAC ) 10 MG tablet TAKE 1 AND 1/2 TABLETS BY MOUTH DAILY 135 tablet 1   fluticasone  (FLONASE ) 50 MCG/ACT nasal spray SHAKE LIQUID AND USE 2 SPRAYS IN EACH NOSTRIL DAILY     levocetirizine (XYZAL ) 5 MG tablet Take 1 tablet (5 mg total) by mouth every evening. 30 tablet 5   levothyroxine  (SYNTHROID ) 50 MCG tablet Take 50 mcg by mouth daily before breakfast.     liothyronine (CYTOMEL) 5 MCG tablet Take by mouth.     Multiple Vitamin (MULTIVITAMIN) tablet Take 1 tablet by mouth daily.     Olopatadine  HCl 0.7 % SOLN Apply 1 drop to eye daily as needed (itchy/watery eyes). 2.5 mL 5   Olopatadine -Mometasone (RYALTRIS ) 665-25 MCG/ACT SUSP Place 1-2 sprays into the nose in the  morning and at bedtime. 29 g 5   Albuterol -Budesonide (AIRSUPRA ) 90-80 MCG/ACT AERO Inhale 2 puffs into the lungs every 4 (four) hours as needed (coughing, wheezing, chest tightness). Do not exceed 12 puffs in 24 hours. (Patient not taking: Reported on 06/19/2024) 10.7  g 2   Carbinoxamine  Maleate 4 MG TABS Take 1 to 2 tablets twice a day if needed for nasal symptoms (Patient not taking: Reported on 06/19/2024) 120 tablet 5   cromolyn  (OPTICROM ) 4 % ophthalmic solution Place 2 drops into both eyes 4 (four) times daily as needed. (Patient not taking: Reported on 06/19/2024) 10 mL 5   FLUoxetine  (PROZAC ) 20 MG tablet Take 1 tablet (20 mg total) by mouth daily. 90 tablet 1   lisdexamfetamine (VYVANSE ) 40 MG capsule Take 1 capsule (40 mg total) by mouth daily before breakfast. 30 capsule 0   lisdexamfetamine (VYVANSE ) 40 MG capsule Take 1 capsule (40 mg total) by mouth daily before breakfast. 30 capsule 0   lisdexamfetamine (VYVANSE ) 40 MG capsule Take 1 capsule (40 mg total) by mouth daily before breakfast. 30 capsule 0   No facility-administered medications prior to visit.   Past Medical History:  Diagnosis Date   Allergy     Anxiety    Asthma    Depression    Endometriosis 2016   2019   Thyroid  disease    Past Surgical History:  Procedure Laterality Date   ADENOIDECTOMY     TONSILLECTOMY     Allergies  Allergen Reactions   Penicillins Dermatitis, Hives, Itching, Rash and Swelling      Objective:    Physical Exam Vitals and nursing note reviewed.  Constitutional:      Appearance: Normal appearance.  Cardiovascular:     Rate and Rhythm: Normal rate and regular rhythm.  Pulmonary:     Effort: Pulmonary effort is normal.     Breath sounds: Normal breath sounds.  Musculoskeletal:        General: Normal range of motion.  Skin:    General: Skin is warm and dry.  Neurological:     Mental Status: She is alert.  Psychiatric:        Mood and Affect: Mood normal.        Behavior: Behavior normal.    BP 120/76 (BP Location: Left Arm, Patient Position: Sitting, Cuff Size: Large)   Pulse 69   Temp 98.2 F (36.8 C) (Temporal)   Ht 5' 7 (1.702 m)   Wt 196 lb 6.4 oz (89.1 kg)   SpO2 99%   BMI 30.76 kg/m  Wt Readings from Last 3 Encounters:   06/19/24 196 lb 6.4 oz (89.1 kg)  03/24/24 194 lb 12.8 oz (88.4 kg)  02/10/24 198 lb 3.2 oz (89.9 kg)       Lucius Krabbe, NP

## 2024-06-19 NOTE — Assessment & Plan Note (Signed)
 Ongoing focus and motivation issues despite 40 mg Vyvanse . Discussed increasing to 50 mg with potential side effects. She agreed to monitor for side effects. - Increase Vyvanse  to 50 mg daily for one month. - Monitor for side effects such as anxiety, jitteriness, and elevated heart rate. - Reassess effectiveness and tolerability after one month via MyChart - F/U 3 mos, virtual ok

## 2024-06-19 NOTE — Assessment & Plan Note (Signed)
 Currently on 15 mg fluoxetine . Previously increased to 20 mg but had side effects- increased sugar cravings. - Continue fluoxetine  15 mg daily, refill sent. - Consider non-pharmacological interventions for stress management, such as yoga and gardening. - F/U 6mos

## 2024-06-22 DIAGNOSIS — M542 Cervicalgia: Secondary | ICD-10-CM | POA: Diagnosis not present

## 2024-06-22 DIAGNOSIS — R202 Paresthesia of skin: Secondary | ICD-10-CM | POA: Diagnosis not present

## 2024-06-22 DIAGNOSIS — M546 Pain in thoracic spine: Secondary | ICD-10-CM | POA: Diagnosis not present

## 2024-06-22 DIAGNOSIS — M5451 Vertebrogenic low back pain: Secondary | ICD-10-CM | POA: Diagnosis not present

## 2024-06-23 ENCOUNTER — Ambulatory Visit (INDEPENDENT_AMBULATORY_CARE_PROVIDER_SITE_OTHER)

## 2024-06-23 DIAGNOSIS — J309 Allergic rhinitis, unspecified: Secondary | ICD-10-CM

## 2024-07-06 DIAGNOSIS — F4323 Adjustment disorder with mixed anxiety and depressed mood: Secondary | ICD-10-CM | POA: Diagnosis not present

## 2024-07-07 ENCOUNTER — Ambulatory Visit (INDEPENDENT_AMBULATORY_CARE_PROVIDER_SITE_OTHER)

## 2024-07-07 DIAGNOSIS — J309 Allergic rhinitis, unspecified: Secondary | ICD-10-CM

## 2024-07-16 ENCOUNTER — Ambulatory Visit (INDEPENDENT_AMBULATORY_CARE_PROVIDER_SITE_OTHER)

## 2024-07-16 DIAGNOSIS — J309 Allergic rhinitis, unspecified: Secondary | ICD-10-CM | POA: Diagnosis not present

## 2024-07-23 ENCOUNTER — Ambulatory Visit (INDEPENDENT_AMBULATORY_CARE_PROVIDER_SITE_OTHER)

## 2024-07-23 DIAGNOSIS — J309 Allergic rhinitis, unspecified: Secondary | ICD-10-CM | POA: Diagnosis not present

## 2024-07-23 DIAGNOSIS — F4323 Adjustment disorder with mixed anxiety and depressed mood: Secondary | ICD-10-CM | POA: Diagnosis not present

## 2024-08-04 ENCOUNTER — Encounter: Payer: Self-pay | Admitting: Family

## 2024-08-06 ENCOUNTER — Ambulatory Visit (INDEPENDENT_AMBULATORY_CARE_PROVIDER_SITE_OTHER)

## 2024-08-06 DIAGNOSIS — J309 Allergic rhinitis, unspecified: Secondary | ICD-10-CM | POA: Diagnosis not present

## 2024-08-18 DIAGNOSIS — F4323 Adjustment disorder with mixed anxiety and depressed mood: Secondary | ICD-10-CM | POA: Diagnosis not present

## 2024-08-20 ENCOUNTER — Ambulatory Visit (INDEPENDENT_AMBULATORY_CARE_PROVIDER_SITE_OTHER)

## 2024-08-20 DIAGNOSIS — J309 Allergic rhinitis, unspecified: Secondary | ICD-10-CM | POA: Diagnosis not present

## 2024-08-27 DIAGNOSIS — F4323 Adjustment disorder with mixed anxiety and depressed mood: Secondary | ICD-10-CM | POA: Diagnosis not present

## 2024-09-23 ENCOUNTER — Encounter: Payer: Self-pay | Admitting: Family

## 2024-09-24 ENCOUNTER — Other Ambulatory Visit: Payer: Self-pay | Admitting: Medical Genetics

## 2024-09-24 DIAGNOSIS — F4323 Adjustment disorder with mixed anxiety and depressed mood: Secondary | ICD-10-CM | POA: Diagnosis not present

## 2024-09-25 ENCOUNTER — Telehealth (INDEPENDENT_AMBULATORY_CARE_PROVIDER_SITE_OTHER): Payer: Self-pay | Admitting: Family

## 2024-09-25 DIAGNOSIS — F900 Attention-deficit hyperactivity disorder, predominantly inattentive type: Secondary | ICD-10-CM

## 2024-09-25 MED ORDER — LISDEXAMFETAMINE DIMESYLATE 50 MG PO CAPS: 50.0000 mg | ORAL_CAPSULE | Freq: Every day | ORAL | 0 refills | Status: AC

## 2024-09-25 NOTE — Progress Notes (Signed)
 MyChart Video Visit    Virtual Visit via Video Note   This format is felt to be most appropriate for this patient at this time. Physical exam was limited by quality of the video and audio technology used for the visit. CMA was able to get the patient set up on a video visit.  Patient location: Home. Patient and provider in visit Provider location: Office  I discussed the limitations of evaluation and management by telemedicine and the availability of in person appointments. The patient expressed understanding and agreed to proceed.  Visit Date: 09/25/2024  Today's healthcare provider: Lucius Krabbe, NP     Subjective:   Patient ID: Dana Underwood, female    DOB: 19-Jul-1981, 43 y.o.   MRN: [id number]  Chief Complaint  Patient presents with   ADHD   Anxiety  HPI: ADHD follow up - Difficulty focusing and managing daily tasks - Increased difficulty with focus and task management attributed to stress from being a full-time mother - Procrastination and difficulty completing tasks - Current regimen of Vyvanse  50mg  is effective for focus and motivation, dose increased from 40mg  last visit - Previously switched from Adderall to Vyvanse  due to availability issues  ASSESSMENT & PLAN Problem List Items Addressed This Visit     ADHD (attention deficit hyperactivity disorder), inattentive type   Better focus and motivation w/Vyvanse  50mg  daily. - Refill Vyvanse  50 mg daily for 30pills with 3 RF on file. - Will check with pharm & ins to see if 90d supply offered. - Continue to monitor for side effects such as anxiety, jitteriness, and elevated heart rate. - F/U 4 mos, in office      Relevant Medications   lisdexamfetamine (VYVANSE ) 50 MG capsule (Start on 10/25/2024)   lisdexamfetamine (VYVANSE ) 50 MG capsule (Start on 11/25/2024)   lisdexamfetamine (VYVANSE ) 50 MG capsule (Start on 12/26/2024)   lisdexamfetamine (VYVANSE ) 50 MG capsule   Past Medical History:   Diagnosis Date   Allergy     Anxiety    Asthma    Depression    Endometriosis 2016   2019   Thyroid  disease     Past Surgical History:  Procedure Laterality Date   ADENOIDECTOMY     TONSILLECTOMY      Outpatient Medications Prior to Visit  Medication Sig Dispense Refill   cetirizine  (ZYRTEC  ALLERGY ) 10 MG tablet Take 1 tablet (10 mg total) by mouth at bedtime. 30 tablet 2   EPINEPHrine  0.3 mg/0.3 mL IJ SOAJ injection Inject 0.3 mg into the muscle as needed for anaphylaxis. 2 each 1   FLUoxetine  (PROZAC ) 10 MG tablet Take 1.5 tablets (15 mg total) by mouth daily.     fluticasone  (FLONASE ) 50 MCG/ACT nasal spray SHAKE LIQUID AND USE 2 SPRAYS IN EACH NOSTRIL DAILY     levocetirizine (XYZAL ) 5 MG tablet Take 1 tablet (5 mg total) by mouth every evening. 30 tablet 5   levothyroxine  (SYNTHROID ) 50 MCG tablet Take 50 mcg by mouth daily before breakfast.     liothyronine (CYTOMEL) 5 MCG tablet Take by mouth.     Multiple Vitamin (MULTIVITAMIN) tablet Take 1 tablet by mouth daily.     Olopatadine  HCl 0.7 % SOLN Apply 1 drop to eye daily as needed (itchy/watery eyes). 2.5 mL 5   Olopatadine -Mometasone (RYALTRIS ) 665-25 MCG/ACT SUSP Place 1-2 sprays into the nose in the morning and at bedtime. 29 g 5   lisdexamfetamine (VYVANSE ) 50 MG capsule Take 1 capsule (50 mg total) by mouth daily  before breakfast. 30 capsule 0   lisdexamfetamine (VYVANSE ) 50 MG capsule Take 1 capsule (50 mg total) by mouth daily before breakfast. 30 capsule 0   lisdexamfetamine (VYVANSE ) 50 MG capsule Take 1 capsule (50 mg total) by mouth daily before breakfast. 30 capsule 0   No facility-administered medications prior to visit.    Allergies  Allergen Reactions   Penicillins Dermatitis, Hives, Itching, Rash and Swelling       Objective:   Physical Exam Vitals and nursing note reviewed.  Constitutional:      General: Pt is not in acute distress.    Appearance: Normal appearance.  HENT:     Head:  Normocephalic.  Pulmonary:     Effort: No respiratory distress.  Musculoskeletal:     Cervical back: Normal range of motion.  Skin:    General: Skin is dry.     Coloration: Skin is not pale.  Neurological:     Mental Status: Pt is alert and oriented to person, place, and time.  Psychiatric:        Mood and Affect: Mood normal.   There were no vitals taken for this visit.  Wt Readings from Last 3 Encounters:  06/19/24 196 lb 6.4 oz (89.1 kg)  03/24/24 194 lb 12.8 oz (88.4 kg)  02/10/24 198 lb 3.2 oz (89.9 kg)      I discussed the assessment and treatment plan with the patient. The patient was provided an opportunity to ask questions and all were answered. The patient agreed with the plan and demonstrated an understanding of the instructions.   The patient was advised to call back or seek an in-person evaluation if the symptoms worsen or if the condition fails to improve as anticipated.  Lucius Krabbe, NP [facility] [phone] (phone) [phone] (fax)  [facility]

## 2024-09-25 NOTE — Assessment & Plan Note (Signed)
 Better focus and motivation w/Vyvanse  50mg  daily. - Refill Vyvanse  50 mg daily for 30pills with 3 RF on file. - Will check with pharm & ins to see if 90d supply offered. - Continue to monitor for side effects such as anxiety, jitteriness, and elevated heart rate. - F/U 4 mos, in office

## 2024-10-01 DIAGNOSIS — F4323 Adjustment disorder with mixed anxiety and depressed mood: Secondary | ICD-10-CM | POA: Diagnosis not present

## 2024-10-08 DIAGNOSIS — Z124 Encounter for screening for malignant neoplasm of cervix: Secondary | ICD-10-CM | POA: Diagnosis not present

## 2024-10-08 DIAGNOSIS — Z01419 Encounter for gynecological examination (general) (routine) without abnormal findings: Secondary | ICD-10-CM | POA: Diagnosis not present

## 2024-10-08 DIAGNOSIS — Z1151 Encounter for screening for human papillomavirus (HPV): Secondary | ICD-10-CM | POA: Diagnosis not present

## 2024-10-08 DIAGNOSIS — Z6831 Body mass index (BMI) 31.0-31.9, adult: Secondary | ICD-10-CM | POA: Diagnosis not present

## 2024-10-15 DIAGNOSIS — F4323 Adjustment disorder with mixed anxiety and depressed mood: Secondary | ICD-10-CM | POA: Diagnosis not present

## 2024-11-03 ENCOUNTER — Other Ambulatory Visit: Payer: Self-pay
